# Patient Record
Sex: Female | Born: 1995
Health system: Southern US, Community
[De-identification: ages and names within clinical notes are randomized; demographics above are authoritative.]

## PROBLEM LIST (undated history)

## (undated) ENCOUNTER — Inpatient Hospital Stay (HOSPITAL_COMMUNITY): Payer: Self-pay

## (undated) DIAGNOSIS — A549 Gonococcal infection, unspecified: Secondary | ICD-10-CM

## (undated) DIAGNOSIS — D649 Anemia, unspecified: Secondary | ICD-10-CM

## (undated) HISTORY — DX: Anemia, unspecified: D64.9

## (undated) HISTORY — PX: NO PAST SURGERIES: SHX2092

---

## 2014-07-29 ENCOUNTER — Encounter (HOSPITAL_COMMUNITY): Payer: Self-pay | Admitting: Emergency Medicine

## 2014-07-29 ENCOUNTER — Emergency Department (HOSPITAL_COMMUNITY)
Admission: EM | Admit: 2014-07-29 | Discharge: 2014-07-29 | Disposition: A | Discharge status: Home / Self Care | Payer: Self-pay | Attending: Emergency Medicine | Admitting: Emergency Medicine

## 2014-07-29 DIAGNOSIS — Z3202 Encounter for pregnancy test, result negative: Secondary | ICD-10-CM | POA: Insufficient documentation

## 2014-07-29 DIAGNOSIS — N898 Other specified noninflammatory disorders of vagina: Secondary | ICD-10-CM | POA: Insufficient documentation

## 2014-07-29 DIAGNOSIS — A5901 Trichomonal vulvovaginitis: Secondary | ICD-10-CM | POA: Insufficient documentation

## 2014-07-29 DIAGNOSIS — Z79899 Other long term (current) drug therapy: Secondary | ICD-10-CM | POA: Insufficient documentation

## 2014-07-29 LAB — URINALYSIS, ROUTINE W REFLEX MICROSCOPIC
BILIRUBIN URINE: NEGATIVE
Glucose, UA: NEGATIVE mg/dL
Hgb urine dipstick: NEGATIVE
KETONES UR: NEGATIVE mg/dL
Nitrite: NEGATIVE
PH: 7 (ref 5.0–8.0)
PROTEIN: NEGATIVE mg/dL
Specific Gravity, Urine: 1.021 (ref 1.005–1.030)
Urobilinogen, UA: 1 mg/dL (ref 0.0–1.0)

## 2014-07-29 LAB — URINE MICROSCOPIC-ADD ON

## 2014-07-29 LAB — WET PREP, GENITAL
Clue Cells Wet Prep HPF POC: NONE SEEN
Yeast Wet Prep HPF POC: NONE SEEN

## 2014-07-29 LAB — POC URINE PREG, ED: Preg Test, Ur: NEGATIVE

## 2014-07-29 MED ORDER — METRONIDAZOLE 500 MG PO TABS
2000.0000 mg | ORAL_TABLET | Freq: Once | ORAL | Status: AC
  Administered 2014-07-29: 2000 mg via ORAL
  Filled 2014-07-29: qty 4

## 2014-07-29 NOTE — Discharge Instructions (Signed)
1. Medications:  usual home medications 2. Treatment: rest, drink plenty of fluids, no sexual intercourse for 7 days after treatment 3. Follow Up: Please followup with your primary doctor and the [facility] clinic for discussion of your diagnoses and further evaluation after today's visit; if you do not have a primary care doctor use the resource guide provided to find one;      Trichomoniasis Trichomoniasis is an infection caused by an organism called Trichomonas. The infection can affect both women and men. In women, the outer female genitalia and the vagina are affected. In men, the penis is mainly affected, but the prostate and other reproductive organs can also be involved. Trichomoniasis is a sexually transmitted infection (STI) and is most often passed to another person through sexual contact.  RISK FACTORS  Having unprotected sexual intercourse.  Having sexual intercourse with an infected partner. SIGNS AND SYMPTOMS  Symptoms of trichomoniasis in women include:  Abnormal gray-green frothy vaginal discharge.  Itching and irritation of the vagina.  Itching and irritation of the area outside the vagina. Symptoms of trichomoniasis in men include:   Penile discharge with or without pain.  Pain during urination. This results from inflammation of the urethra. DIAGNOSIS  Trichomoniasis may be found during a Pap test or physical exam. Your health care provider may use one of the following methods to help diagnose this infection:  Examining vaginal discharge under a microscope. For men, urethral discharge would be examined.  Testing the pH of the vagina with a test tape.  Using a vaginal swab test that checks for the Trichomonas organism. A test is available that provides results within a few minutes.  Doing a culture test for the organism. This is not usually needed. TREATMENT   You may be given medicine to fight the infection. Women should inform their health care provider if they  could be or are pregnant. Some medicines used to treat the infection should not be taken during pregnancy.  Your health care provider may recommend over-the-counter medicines or creams to decrease itching or irritation.  Your sexual partner will need to be treated if infected. HOME CARE INSTRUCTIONS   Take medicines only as directed by your health care provider.  Take over-the-counter medicine for itching or irritation as directed by your health care provider.  Do not have sexual intercourse while you have the infection.  Women should not douche or wear tampons while they have the infection.  Discuss your infection with your partner. Your partner may have gotten the infection from you, or you may have gotten it from your partner.  Have your sex partner get examined and treated if necessary.  Practice safe, informed, and protected sex.  See your health care provider for other STI testing. SEEK MEDICAL CARE IF:   You still have symptoms after you finish your medicine.  You develop abdominal pain.  You have pain when you urinate.  You have bleeding after sexual intercourse.  You develop a rash.  Your medicine makes you sick or makes you throw up (vomit). MAKE SURE YOU:  Understand these instructions.  Will watch your condition.  Will get help right away if you are not doing well or get worse. Document Released: 05/05/2001 Document Revised: 03/26/2014 Document Reviewed: 08/21/2013 [facility]: [facility] Patient Information 2015 [place], [place]. This information is not intended to replace advice given to you by your health care provider. Make sure you discuss any questions you have with your health care provider.  Vaginitis Vaginitis is an inflammation of  the vagina. It can happen when the normal bacteria and yeast in the vagina grow too much. There are different types. Treatment will depend on the type you have. HOME CARE  Take all medicines as told by your doctor.  Keep your vagina  area clean and dry. Avoid soap. Rinse the area with water.  Avoid washing and cleaning out the vagina (douching).  Do not use tampons or have sex (intercourse) until your treatment is done.  Wipe from front to back after going to the restroom.  Wear cotton underwear.  Avoid wearing underwear while you sleep until your vaginitis is gone.  Avoid tight pants. Avoid underwear or nylons without a cotton panel.  Take off wet clothing (such as a bathing suit) as soon as you can.  Use mild, unscented products. Avoid fabric softeners and scented:  Feminine sprays.  Laundry detergents.  Tampons.  Soaps or bubble baths.  Practice safe sex and use condoms. GET HELP RIGHT AWAY IF:   You have belly (abdominal) pain.  You have a fever or lasting symptoms for more than 2-3 days.  You have a fever and your symptoms suddenly get worse. MAKE SURE YOU:   Understand these instructions.  Will watch this condition.  Will get help right away if you are not doing well or get worse. Document Released: 02/05/2009 Document Revised: 08/03/2012 Document Reviewed: 04/21/2012 [facility] Patient Information 2015 [place], [place]. This information is not intended to replace advice given to you by your health care provider. Make sure you discuss any questions you have with your health care provider.

## 2014-07-29 NOTE — ED Provider Notes (Signed)
History/physical exam/procedure(s) were performed by non-physician practitioner and as supervising physician I was immediately available for consultation/collaboration. I have reviewed all notes and am in agreement with care and plan.   Hilario Quarry, MD 07/29/14 [phone]

## 2014-07-29 NOTE — ED Provider Notes (Signed)
CSN: [id number]     Arrival date & time 07/29/14  1418 History   First MD Initiated Contact with Patient 07/29/14 1530     Chief Complaint  Patient presents with  . Vaginal Discharge     (Consider location/radiation/quality/duration/timing/severity/associated sxs/prior Treatment) The history is provided by the patient and medical records. No language interpreter was used.    Diana Maxwell is a 18 y.o. female  with no medical Hx presents to the Emergency Department complaining of gradual, persistent, progressively worsening vaginal discharge increase with foul odor onset 1.5 months ago.  Pt reports a darkening of her urine, but denies urinary frequency, urgency or dysuria.  Pt reports she is not currently sexually active and last partner was in June.  Pt active with female partners in the past and no hx of STD, though she admits to genital warts. NO treatments pta.  Pt denies fever, chills, headache, neck pain, chest pain, abd pain, N/V/D, weakness, dizziness, syncope.  LMP: Jul 12, 2014.     History reviewed. No pertinent past medical history. History reviewed. No pertinent past surgical history. History reviewed. No pertinent family history. History  Substance Use Topics  . Smoking status: Never Smoker   . Smokeless tobacco: Not on file  . Alcohol Use: No   OB History   Grav Para Term Preterm Abortions TAB SAB Ect Mult Living                 Review of Systems  Constitutional: Negative for fever, diaphoresis, appetite change, fatigue and unexpected weight change.  HENT: Negative for mouth sores.   Eyes: Negative for visual disturbance.  Respiratory: Negative for cough, chest tightness, shortness of breath and wheezing.   Cardiovascular: Negative for chest pain.  Gastrointestinal: Negative for nausea, vomiting, abdominal pain, diarrhea and constipation.  Endocrine: Negative for polydipsia, polyphagia and polyuria.  Genitourinary: Positive for vaginal discharge. Negative for dysuria,  urgency, frequency and hematuria.  Musculoskeletal: Negative for back pain and neck stiffness.  Skin: Negative for rash.  Allergic/Immunologic: Negative for immunocompromised state.  Neurological: Negative for syncope, light-headedness and headaches.  Hematological: Does not bruise/bleed easily.  Psychiatric/Behavioral: Negative for sleep disturbance. The patient is not nervous/anxious.       Allergies  Review of patient's allergies indicates no known allergies.  Home Medications   Prior to Admission medications   Medication Sig Start Date End Date Taking? Authorizing Provider  Biotin 5 MG TABS Take 5 mg by mouth daily.   Yes Historical Provider, MD  Multiple Vitamin (MULTIVITAMIN WITH MINERALS) TABS tablet Take 1 tablet by mouth daily.   Yes Historical Provider, MD   BP 113/64  Pulse 62  Temp(Src) 98.4 F (36.9 C) (Oral)  Resp 16  Wt 165 lb (74.844 kg)  SpO2 100%  LMP 07/12/2014 Physical Exam  Nursing note and vitals reviewed. Constitutional: She is oriented to person, place, and time. She appears well-developed and well-nourished. No distress.  HENT:  Head: Normocephalic and atraumatic.  Eyes: Conjunctivae are normal.  Neck: Normal range of motion.  Cardiovascular: Normal rate, regular rhythm, normal heart sounds and intact distal pulses.   No murmur heard. Pulmonary/Chest: Effort normal and breath sounds normal. No respiratory distress. She has no wheezes.  Abdominal: Soft. Bowel sounds are normal. She exhibits no distension. There is no tenderness. There is no rebound and no guarding. Hernia confirmed negative in the right inguinal area and confirmed negative in the left inguinal area.  Genitourinary: Uterus normal. No labial fusion. There is  no rash, tenderness or lesion on the right labia. There is no rash, tenderness or lesion on the left labia. Uterus is not deviated, not enlarged, not fixed and not tender. Cervix exhibits no motion tenderness, no discharge and no  friability. Right adnexum displays no mass, no tenderness and no fullness. Left adnexum displays no mass, no tenderness and no fullness. There is erythema around the vagina. No tenderness or bleeding around the vagina. No foreign body around the vagina. No signs of injury around the vagina. Vaginal discharge ( Small, white, thin, malodorous) found.  No adnexal or cervical motion tenderness Erythema of the vaginal walls and cervix without friability  Musculoskeletal: Normal range of motion. She exhibits no edema.  Lymphadenopathy:       Right: No inguinal adenopathy present.       Left: No inguinal adenopathy present.  Neurological: She is alert and oriented to person, place, and time. She exhibits normal muscle tone. Coordination normal.  Skin: Skin is warm and dry. She is not diaphoretic. No erythema.  Psychiatric: She has a normal mood and affect.    ED Course  Procedures (including critical care time) Labs Review Labs Reviewed  WET PREP, GENITAL - Abnormal; Notable for the following:    Trich, Wet Prep FEW (*)    WBC, Wet Prep HPF POC FEW (*)    All other components within normal limits  URINALYSIS, ROUTINE W REFLEX MICROSCOPIC - Abnormal; Notable for the following:    Leukocytes, UA SMALL (*)    All other components within normal limits  GC/CHLAMYDIA PROBE AMP  URINE MICROSCOPIC-ADD ON  POC URINE PREG, ED    Imaging Review No results found.   EKG Interpretation None      MDM   Final diagnoses:  Trichomonal vaginitis    Diana Maxwell presents with 1-1/2 months of vaginal discharge. Patient denies new sexual partners or history of STD. Patient is using a scented body wash. Denies history of BV. On exam patient with evidence of vaginitis but no cervical motion tenderness, abdominal pain or cervical friability to suggest PID. UA and wet prep pending.   5:45 PM Wet prep with trichomonas.  Patient treated here in the emergency department. Gonorrhea and Chlamydia cultures  pending. UA without evidence of urinary tract infection. Patient without cervical motion tenderness to suggest PID and pregnancy test negative. Patient is to followup with her OB/GYN within one week for further evaluation. She's to return here to the emergency department for fevers, abdominal pain or intractable vomiting here  I have personally reviewed patient's vitals, nursing note and any pertinent labs or imaging.  I performed an undressed physical exam.    At this time, it has been determined that no acute conditions requiring further emergency intervention. The patient/guardian have been advised of the diagnosis and plan. I reviewed all labs and imaging including any potential incidental findings.   Vital signs are stable at discharge.   BP 113/64  Pulse 62  Temp(Src) 98.4 F (36.9 C) (Oral)  Resp 16  Wt 165 lb (74.844 kg)  SpO2 100%  LMP 07/12/2014        Dierdre Forth, PA-C 07/29/14 1745

## 2014-07-29 NOTE — ED Notes (Signed)
Pt states that she has been having yellow, foul smelling vaginal discharge x 1 month.  When asked if something happened to require her to come to the ER today, pt states "I thought I should go get it checked out".  Denies pain.  Denies unprotected sex.  Denies sex at all.

## 2014-07-29 NOTE — ED Notes (Signed)
Initial Contact - pt A+Ox4, reports vaginal discharge, foul smelling xweeks.  Pt denies dysuria or other complaints.  Skin PWD.  Speaking full/clear sentences, rr even/un-lab.  Changed to hospital gown.  NAD.

## 2014-07-31 LAB — GC/CHLAMYDIA PROBE AMP
CT PROBE, AMP APTIMA: NEGATIVE
GC PROBE AMP APTIMA: NEGATIVE

## 2014-09-01 ENCOUNTER — Emergency Department (HOSPITAL_COMMUNITY)
Admission: EM | Admit: 2014-09-01 | Discharge: 2014-09-01 | Disposition: A | Discharge status: Home / Self Care | Payer: Self-pay | Attending: Emergency Medicine | Admitting: Emergency Medicine

## 2014-09-01 ENCOUNTER — Encounter (HOSPITAL_COMMUNITY): Payer: Self-pay | Admitting: Emergency Medicine

## 2014-09-01 DIAGNOSIS — Z3202 Encounter for pregnancy test, result negative: Secondary | ICD-10-CM | POA: Insufficient documentation

## 2014-09-01 DIAGNOSIS — A599 Trichomoniasis, unspecified: Secondary | ICD-10-CM

## 2014-09-01 DIAGNOSIS — A5909 Other urogenital trichomoniasis: Secondary | ICD-10-CM | POA: Insufficient documentation

## 2014-09-01 LAB — POC URINE PREG, ED: Preg Test, Ur: NEGATIVE

## 2014-09-01 LAB — URINALYSIS, ROUTINE W REFLEX MICROSCOPIC
Bilirubin Urine: NEGATIVE
GLUCOSE, UA: NEGATIVE mg/dL
Hgb urine dipstick: NEGATIVE
Ketones, ur: NEGATIVE mg/dL
Nitrite: NEGATIVE
PH: 7.5 (ref 5.0–8.0)
Protein, ur: NEGATIVE mg/dL
Specific Gravity, Urine: 1.019 (ref 1.005–1.030)
Urobilinogen, UA: 1 mg/dL (ref 0.0–1.0)

## 2014-09-01 LAB — WET PREP, GENITAL
Clue Cells Wet Prep HPF POC: NONE SEEN
Yeast Wet Prep HPF POC: NONE SEEN

## 2014-09-01 LAB — HIV ANTIBODY (ROUTINE TESTING W REFLEX): HIV 1&2 Ab, 4th Generation: NONREACTIVE

## 2014-09-01 LAB — URINE MICROSCOPIC-ADD ON

## 2014-09-01 LAB — RPR

## 2014-09-01 MED ORDER — CEFTRIAXONE SODIUM 250 MG IJ SOLR
250.0000 mg | Freq: Once | INTRAMUSCULAR | Status: AC
  Administered 2014-09-01: 250 mg via INTRAMUSCULAR
  Filled 2014-09-01: qty 250

## 2014-09-01 MED ORDER — AZITHROMYCIN 250 MG PO TABS
1000.0000 mg | ORAL_TABLET | Freq: Once | ORAL | Status: AC
  Administered 2014-09-01: 1000 mg via ORAL
  Filled 2014-09-01: qty 4

## 2014-09-01 MED ORDER — STERILE WATER FOR INJECTION IJ SOLN
INTRAMUSCULAR | Status: AC
  Administered 2014-09-01: 1.5 mL
  Filled 2014-09-01: qty 10

## 2014-09-01 MED ORDER — METRONIDAZOLE 500 MG PO TABS
2000.0000 mg | ORAL_TABLET | Freq: Once | ORAL | Status: AC
  Administered 2014-09-01: 2000 mg via ORAL
  Filled 2014-09-01: qty 4

## 2014-09-01 NOTE — ED Notes (Signed)
Pt c/o vaginal discharge since 9/6.  Sts odorous, frothy, cream-colored discharge and yesterday noticed a brown tinging.  [facility]  Sts symptoms subsided for "a few days," but have returned.

## 2014-09-01 NOTE — Discharge Instructions (Signed)
Sexually Transmitted Disease We have obtained an HIV (AIDS) test and also a test for syphilis. Those tests are not back today and he will be called if they are abnormal. [facility] days to get a gynecologist, and to schedule the next available appointment. Use a condom each time that you have sex A sexually transmitted disease (STD) is a disease or infection that may be passed (transmitted) from person to person, usually during sexual activity. This may happen by way of saliva, semen, blood, vaginal mucus, or urine. Common STDs include:   Gonorrhea.   Chlamydia.   Syphilis.   HIV and AIDS.   Genital herpes.   Hepatitis B and C.   Trichomonas.   Human papillomavirus (HPV).   Pubic lice.   Scabies.  Mites.  Bacterial vaginosis. WHAT ARE CAUSES OF STDs? An STD may be caused by bacteria, a virus, or parasites. STDs are often transmitted during sexual activity if one person is infected. However, they may also be transmitted through nonsexual means. STDs may be transmitted after:   Sexual intercourse with an infected person.   Sharing sex toys with an infected person.   Sharing needles with an infected person or using unclean piercing or tattoo needles.  Having intimate contact with the genitals, mouth, or rectal areas of an infected person.   Exposure to infected fluids during birth. WHAT ARE THE SIGNS AND SYMPTOMS OF STDs? Different STDs have different symptoms. Some people may not have any symptoms. If symptoms are present, they may include:   Painful or bloody urination.   Pain in the pelvis, abdomen, vagina, anus, throat, or eyes.   A skin rash, itching, or irritation.  Growths, ulcerations, blisters, or sores in the genital and anal areas.  Abnormal vaginal discharge with or without bad odor.   Penile discharge in men.   Fever.   Pain or bleeding during sexual intercourse.   Swollen glands in the groin area.    Yellow skin and eyes (jaundice). This is seen with hepatitis.   Swollen testicles.  Infertility.  Sores and blisters in the mouth. HOW ARE STDs DIAGNOSED? To make a diagnosis, your health care provider may:   Take a medical history.   Perform a physical exam.   Take a sample of any discharge to examine.  Swab the throat, cervix, opening to the penis, rectum, or vagina for testing.  Test a sample of your first morning urine.   Perform blood tests.   Perform a Pap test, if this applies.   Perform a colposcopy.   Perform a laparoscopy.  HOW ARE STDs TREATED? Treatment depends on the STD. Some STDs may be treated but not cured.   Chlamydia, gonorrhea, trichomonas, and syphilis can be cured with antibiotic medicine.   Genital herpes, hepatitis, and HIV can be treated, but not cured, with prescribed medicines. The medicines lessen symptoms.   Genital warts from HPV can be treated with medicine or by freezing, burning (electrocautery), or surgery. Warts may come back.   HPV cannot be cured with medicine or surgery. However, abnormal areas may be removed from the cervix, vagina, or vulva.   If your diagnosis is confirmed, your recent sexual partners need treatment. This is true even if they are symptom-free or have a negative culture or evaluation. They should not have sex until their health care providers say it is okay. HOW CAN I REDUCE MY RISK OF GETTING AN STD? Take these steps to reduce your risk  of getting an STD:  Use latex condoms, dental dams, and water-soluble lubricants during sexual activity. Do not use petroleum jelly or oils.  Avoid having multiple sex partners.  Do not have sex with someone who has other sex partners.  Do not have sex with anyone you do not know or who is at high risk for an STD.  Avoid risky sex practices that can break your skin.  Do not have sex if you have open sores on your mouth or skin.  Avoid drinking too much  alcohol or taking illegal drugs. Alcohol and drugs can affect your judgment and put you in a vulnerable position.  Avoid engaging in oral and anal sex acts.  Get vaccinated for HPV and hepatitis. If you have not received these vaccines in the past, talk to your health care provider about whether one or both might be right for you.   If you are at risk of being infected with HIV, it is recommended that you take a prescription medicine daily to prevent HIV infection. This is called pre-exposure prophylaxis (PrEP). You are considered at risk if:  You are a man who has sex with other men (MSM).  You are a heterosexual man or woman and are sexually active with more than one partner.  You take drugs by injection.  You are sexually active with a partner who has HIV.  Talk with your health care provider about whether you are at high risk of being infected with HIV. If you choose to begin PrEP, you should first be tested for HIV. You should then be tested every 3 months for as long as you are taking PrEP.  WHAT SHOULD I DO IF I THINK I HAVE AN STD?  See your health care provider.   Tell your sexual partner(s). They should be tested and treated for any STDs.  Do not have sex until your health care provider says it is okay. WHEN SHOULD I GET IMMEDIATE MEDICAL CARE? Contact your health care provider right away if:   You have severe abdominal pain.  You are a man and notice swelling or pain in your testicles.  You are a woman and notice swelling or pain in your vagina. Document Released: 01/30/2003 Document Revised: 11/14/2013 Document Reviewed: 05/30/2013 [facility] Patient Information 2015 [place]. This information is not intended to replace advice given to you by your health care provider. Make sure you discuss any questions you have with your health care provider.  Trichomoniasis Trichomoniasis is an infection caused by an organism called Trichomonas. The infection can affect  both women and men. In women, the outer female genitalia and the vagina are affected. In men, the penis is mainly affected, but the prostate and other reproductive organs can also be involved. Trichomoniasis is a sexually transmitted infection (STI) and is most often passed to another person through sexual contact.  RISK FACTORS  Having unprotected sexual intercourse.  Having sexual intercourse with an infected partner. SIGNS AND SYMPTOMS  Symptoms of trichomoniasis in women include:  Abnormal gray-green frothy vaginal discharge.  Itching and irritation of the vagina.  Itching and irritation of the area outside the vagina. Symptoms of trichomoniasis in men include:   Penile discharge with or without pain.  Pain during urination. This results from inflammation of the urethra. DIAGNOSIS  Trichomoniasis may be found during a Pap test or physical exam. Your health care provider may use one of the following methods to help diagnose this infection:  Examining vaginal discharge under  a microscope. For men, urethral discharge would be examined.  Testing the pH of the vagina with a test tape.  Using a vaginal swab test that checks for the Trichomonas organism. A test is available that provides results within a few minutes.  Doing a culture test for the organism. This is not usually needed. TREATMENT   You may be given medicine to fight the infection. Women should inform their health care provider if they could be or are pregnant. Some medicines used to treat the infection should not be taken during pregnancy.  Your health care provider may recommend over-the-counter medicines or creams to decrease itching or irritation.  Your sexual partner will need to be treated if infected. HOME CARE INSTRUCTIONS   Take medicines only as directed by your health care provider.  Take over-the-counter medicine for itching or irritation as directed by your health care provider.  Do not have sexual  intercourse while you have the infection.  Women should not douche or wear tampons while they have the infection.  Discuss your infection with your partner. Your partner may have gotten the infection from you, or you may have gotten it from your partner.  Have your sex partner get examined and treated if necessary.  Practice safe, informed, and protected sex.  See your health care provider for other STI testing. SEEK MEDICAL CARE IF:   You still have symptoms after you finish your medicine.  You develop abdominal pain.  You have pain when you urinate.  You have bleeding after sexual intercourse.  You develop a rash.  Your medicine makes you sick or makes you throw up (vomit). MAKE SURE YOU:  Understand these instructions.  Will watch your condition.  Will get help right away if you are not doing well or get worse. Document Released: 05/05/2001 Document Revised: 03/26/2014 Document Reviewed: 08/21/2013 [facility] Patient Information 2015 [place], [place]. This information is not intended to replace advice given to you by your health care provider. Make sure you discuss any questions you have with your health care provider.

## 2014-09-01 NOTE — ED Provider Notes (Signed)
CSN: [id number]     Arrival date & time 09/01/14  1633 History   First MD Initiated Contact with Patient 09/01/14 1642     Chief Complaint  Patient presents with  . Vaginal Discharge     (Consider location/radiation/quality/duration/timing/severity/associated sxs/prior Treatment) Patient is a 18 y.o. female presenting with vaginal discharge.  Vaginal Discharge  Complains of vaginal discharge times one month. Denies pain anywhere. No fever. No urinary symptoms. Patient treated for trichomoniasis 07/29/2014, when she was treated for same complaint..no fever , no dyspareunmia, no nausea or vomiting no other associated symptoms History reviewed. No pertinent past medical history. History reviewed. No pertinent past surgical history. History reviewed. No pertinent family history. History  Substance Use Topics  . Smoking status: Never Smoker   . Smokeless tobacco: Not on file  . Alcohol Use: No   OB History   Grav Para Term Preterm Abortions TAB SAB Ect Mult Living                 Review of Systems  Constitutional: Negative.   HENT: Negative.   Respiratory: Negative.   Cardiovascular: Negative.   Gastrointestinal: Negative.   Genitourinary: Positive for vaginal discharge.  Musculoskeletal: Negative.   Skin: Negative.   Allergic/Immunologic: Negative.   Psychiatric/Behavioral: Negative.   All other systems reviewed and are negative.     Allergies  Review of patient's allergies indicates no known allergies.  Home Medications   Prior to Admission medications   Medication Sig Start Date End Date Taking? Authorizing Provider  Biotin 5 MG TABS Take 5 mg by mouth daily.    Historical Provider, MD  Multiple Vitamin (MULTIVITAMIN WITH MINERALS) TABS tablet Take 1 tablet by mouth daily.    Historical Provider, MD   BP 132/74  Pulse 64  Temp(Src) 98.1 F (36.7 C) (Oral)  Resp 16  SpO2 100%  LMP 08/22/2014 Physical Exam  Nursing note and vitals reviewed. Constitutional:  She appears well-developed and well-nourished.  HENT:  Head: Normocephalic and atraumatic.  Eyes: Conjunctivae are normal. Pupils are equal, round, and reactive to light.  Neck: Neck supple. No tracheal deviation present. No thyromegaly present.  Cardiovascular: Normal rate and regular rhythm.   No murmur heard. Pulmonary/Chest: Effort normal and breath sounds normal.  Abdominal: Soft. Bowel sounds are normal. She exhibits no distension. There is no tenderness.  Genitourinary: Vaginal discharge found.  White discharge , no external lesion, os closed cerxix mily reddened , slight cervical nmotion tenderness. No adnexal masses or tenderness  Musculoskeletal: Normal range of motion. She exhibits no edema and no tenderness.  Neurological: She is alert. Coordination normal.  Skin: Skin is warm and dry. No rash noted.  Psychiatric: She has a normal mood and affect.    ED Course  Procedures (including critical care time) Labs Review Labs Reviewed  URINALYSIS, ROUTINE W REFLEX MICROSCOPIC  POC URINE PREG, ED    Imaging Review No results found.   EKG Interpretation None     Results for orders placed during the hospital encounter of 09/01/14  WET PREP, GENITAL      Result Value Ref Range   Yeast Wet Prep HPF POC NONE SEEN  NONE SEEN   Trich, Wet Prep FEW (*) NONE SEEN   Clue Cells Wet Prep HPF POC NONE SEEN  NONE SEEN   WBC, Wet Prep HPF POC FEW (*) NONE SEEN  URINALYSIS, ROUTINE W REFLEX MICROSCOPIC      Result Value Ref Range   Color, Urine YELLOW  YELLOW   APPearance CLOUDY (*) CLEAR   Specific Gravity, Urine 1.019  1.005 - 1.030   pH 7.5  5.0 - 8.0   Glucose, UA NEGATIVE  NEGATIVE mg/dL   Hgb urine dipstick NEGATIVE  NEGATIVE   Bilirubin Urine NEGATIVE  NEGATIVE   Ketones, ur NEGATIVE  NEGATIVE mg/dL   Protein, ur NEGATIVE  NEGATIVE mg/dL   Urobilinogen, UA 1.0  0.0 - 1.0 mg/dL   Nitrite NEGATIVE  NEGATIVE   Leukocytes, UA LARGE (*) NEGATIVE  URINE MICROSCOPIC-ADD ON       Result Value Ref Range   Squamous Epithelial / LPF MANY (*) RARE   WBC, UA 21-50  <3 WBC/hpf   RBC / HPF 7-10  <3 RBC/hpf   Bacteria, UA FEW (*) RARE   Urine-Other TRICHOMONAS PRESENT    POC URINE PREG, ED      Result Value Ref Range   Preg Test, Ur NEGATIVE  NEGATIVE   No results found.  MDM  We'll treat empirically for STDs with Rocephin, Zithromax. Also to treat for trichomoniasis with Flagyl.urine is contaminated . Pt denies urinary sx. No need for culture Referral to women's clinic. Safe sex encouraged Diagnosis #1 trichomoniasis #2 cervicitis Final diagnoses:  None        Doug SouSam Sharaya Boruff, MD 09/01/14 1821

## 2014-09-03 LAB — GC/CHLAMYDIA PROBE AMP
CT PROBE, AMP APTIMA: NEGATIVE
GC PROBE AMP APTIMA: NEGATIVE

## 2014-11-23 DIAGNOSIS — A549 Gonococcal infection, unspecified: Secondary | ICD-10-CM

## 2014-11-23 HISTORY — DX: Gonococcal infection, unspecified: A54.9

## 2015-11-19 ENCOUNTER — Encounter (HOSPITAL_COMMUNITY): Payer: Self-pay | Admitting: Emergency Medicine

## 2015-11-19 ENCOUNTER — Emergency Department (HOSPITAL_COMMUNITY)
Admission: EM | Admit: 2015-11-19 | Discharge: 2015-11-20 | Disposition: A | Discharge status: Home / Self Care | Payer: Self-pay | Attending: Emergency Medicine | Admitting: Emergency Medicine

## 2015-11-19 DIAGNOSIS — N12 Tubulo-interstitial nephritis, not specified as acute or chronic: Secondary | ICD-10-CM | POA: Insufficient documentation

## 2015-11-19 DIAGNOSIS — Z3202 Encounter for pregnancy test, result negative: Secondary | ICD-10-CM | POA: Insufficient documentation

## 2015-11-19 LAB — CBC
HCT: 37.9 % (ref 36.0–46.0)
Hemoglobin: 12.8 g/dL (ref 12.0–15.0)
MCH: 29.6 pg (ref 26.0–34.0)
MCHC: 33.8 g/dL (ref 30.0–36.0)
MCV: 87.5 fL (ref 78.0–100.0)
Platelets: 289 10*3/uL (ref 150–400)
RBC: 4.33 MIL/uL (ref 3.87–5.11)
RDW: 12.8 % (ref 11.5–15.5)
WBC: 5.1 10*3/uL (ref 4.0–10.5)

## 2015-11-19 LAB — COMPREHENSIVE METABOLIC PANEL
ALT: 16 U/L (ref 14–54)
AST: 21 U/L (ref 15–41)
Albumin: 4.4 g/dL (ref 3.5–5.0)
Alkaline Phosphatase: 90 U/L (ref 38–126)
Anion gap: 9 (ref 5–15)
BUN: 10 mg/dL (ref 6–20)
CHLORIDE: 103 mmol/L (ref 101–111)
CO2: 28 mmol/L (ref 22–32)
Calcium: 9.4 mg/dL (ref 8.9–10.3)
Creatinine, Ser: 0.93 mg/dL (ref 0.44–1.00)
Glucose, Bld: 108 mg/dL — ABNORMAL HIGH (ref 65–99)
Potassium: 4.6 mmol/L (ref 3.5–5.1)
Sodium: 140 mmol/L (ref 135–145)
Total Bilirubin: 0.5 mg/dL (ref 0.3–1.2)
Total Protein: 7.7 g/dL (ref 6.5–8.1)

## 2015-11-19 LAB — LIPASE, BLOOD: LIPASE: 32 U/L (ref 11–51)

## 2015-11-19 LAB — I-STAT BETA HCG BLOOD, ED (MC, WL, AP ONLY): I-stat hCG, quantitative: <5 m[IU]/mL (ref <5)

## 2015-11-19 NOTE — ED Notes (Addendum)
Pt reports RLQ pain for the past 2 days that is worse with movement. No bowel issues or dysuria. LMP a week ago

## 2015-11-20 ENCOUNTER — Encounter (HOSPITAL_COMMUNITY): Payer: Self-pay | Admitting: Emergency Medicine

## 2015-11-20 LAB — URINALYSIS, ROUTINE W REFLEX MICROSCOPIC
Bilirubin Urine: NEGATIVE
Glucose, UA: NEGATIVE mg/dL
Hgb urine dipstick: NEGATIVE
Ketones, ur: NEGATIVE mg/dL
NITRITE: NEGATIVE
PH: 7.5 (ref 5.0–8.0)
PROTEIN: NEGATIVE mg/dL
Specific Gravity, Urine: 1.016 (ref 1.005–1.030)

## 2015-11-20 LAB — URINE MICROSCOPIC-ADD ON

## 2015-11-20 MED ORDER — CEPHALEXIN 500 MG PO CAPS: 500.0000 mg | ORAL_CAPSULE | Freq: Three times a day (TID) | ORAL | Status: DC

## 2015-11-20 MED ORDER — ONDANSETRON 4 MG PO TBDP: 4.0000 mg | ORAL_TABLET | Freq: Three times a day (TID) | ORAL | Status: DC | PRN

## 2015-11-20 NOTE — ED Notes (Signed)
Requested patient to urinate. 

## 2015-11-20 NOTE — ED Provider Notes (Signed)
CSN: [id number]     Arrival date & time 11/19/15  1833 History  By signing my name below, I, Diana Maxwell, attest that this documentation has been prepared under the direction and in the presence of [name], MD. Electronically Signed: [name], ED Scribe. 11/20/2015. 12:59 AM.   Chief Complaint  Patient presents with  . Abdominal Pain   Patient is a 19 y.o. female presenting with abdominal pain. The history is provided by the patient. No language interpreter was used.  Abdominal Pain Pain location:  R flank Pain quality: aching   Pain radiates to:  Does not radiate Pain severity:  Moderate Onset quality:  Sudden Duration:  2 days Timing:  Constant Progression:  Worsening Chronicity:  New Context: not eating and not recent illness   Worsened by:  Movement Ineffective treatments:  None tried Associated symptoms: no chest pain, no chills, no constipation, no cough, no diarrhea, no dysuria, no fever, no hematemesis, no hematochezia, no hematuria, no nausea, no shortness of breath, no sore throat, no vaginal bleeding, no vaginal discharge and no vomiting   HPI Comments: Diana Maxwell is a 19 y.o. female who presents to the Emergency Department complaining of constant, non-radiating, aching RLQ abdominal pain onset 1 day ago. Pt reports worsening pain with making movement. She reports alleviation with sitting still. She currently denies pain. She denies worsening pain with eating, strenuous activity or injury that could have caused the pain. Pt denies trying anything for her symptoms and denies hx of similar symptoms. She denies fever, chills, cough, nausea, vomiting, hematemesis, diarrhea, constipation, vaginal bleeding, vaginal discharge, hematuria, frequency, or dysuria. LMP was one week ago. Pt is not currently sexually active and has no concerns for STDs at this time.   History reviewed. No pertinent past medical history. History reviewed. No pertinent past surgical  history. History reviewed. No pertinent family history. Social History  Substance Use Topics  . Smoking status: Never Smoker   . Smokeless tobacco: None  . Alcohol Use: No   OB History    No data available     Review of Systems  Constitutional: Negative for fever and chills.  HENT: Negative for sore throat.   Eyes: Negative for visual disturbance.  Respiratory: Negative for cough and shortness of breath.   Cardiovascular: Negative for chest pain.  Gastrointestinal: Positive for abdominal pain. Negative for nausea, vomiting, diarrhea, constipation, hematochezia and hematemesis.  Genitourinary: Positive for flank pain. Negative for dysuria, hematuria, vaginal bleeding, vaginal discharge and difficulty urinating.  Musculoskeletal: Negative for back pain and neck pain.  Skin: Negative for rash.  Neurological: Negative for syncope and headaches.   Allergies  Review of patient's allergies indicates no known allergies.  Home Medications   Prior to Admission medications   Medication Sig Start Date End Date Taking? Authorizing Provider  ibuprofen (ADVIL,MOTRIN) 200 MG tablet Take 400 mg by mouth every 6 (six) hours as needed for headache, mild pain or moderate pain.   Yes Historical Provider, MD  cephALEXin (KEFLEX) 500 MG capsule Take 1 capsule (500 mg total) by mouth 3 (three) times daily. 11/20/15   [name], MD  ondansetron (ZOFRAN ODT) 4 MG disintegrating tablet Take 1 tablet (4 mg total) by mouth every 8 (eight) hours as needed for nausea or vomiting. 11/20/15   [name], MD   BP 113/79 mmHg  Pulse 78  Temp(Src) 97.4 F (36.3 C) (Oral)  Resp 16  Ht 5' 11.5" (1.816 m)  Wt 165 lb (74.844 kg)  BMI 22.69 kg/m2  SpO2 95%  LMP 11/06/2015 (Exact Date) Physical Exam  Constitutional: She is oriented to person, place, and time. She appears well-developed and well-nourished. No distress.  HENT:  Head: Normocephalic and atraumatic.  Eyes: Conjunctivae and EOM are  normal.  Neck: Normal range of motion.  Cardiovascular: Normal rate, regular rhythm, normal heart sounds and intact distal pulses.  Exam reveals no gallop and no friction rub.   No murmur heard. Pulmonary/Chest: Effort normal and breath sounds normal. No respiratory distress. She has no wheezes. She has no rales.  Clear to auscultation bilaterally  Abdominal: Soft. She exhibits no distension. There is tenderness. There is no guarding, no CVA tenderness, no tenderness at McBurney's point and negative Murphy's sign.  Right lateral abdominal tenderness  Musculoskeletal: She exhibits no edema or tenderness.  Neurological: She is alert and oriented to person, place, and time.  Skin: Skin is warm and dry. No rash noted. She is not diaphoretic. No erythema.  Nursing note and vitals reviewed.   ED Course  Procedures (including critical care time) DIAGNOSTIC STUDIES: Oxygen Saturation is 95% on RA, adequate by my interpretation.    COORDINATION OF CARE: 12:51 AM-Discussed treatment plan which includes labs with pt at bedside and pt agreed to plan.    Labs Review Labs Reviewed  COMPREHENSIVE METABOLIC PANEL - Abnormal; Notable for the following:    Glucose, Bld 108 (*)    All other components within normal limits  URINALYSIS, ROUTINE W REFLEX MICROSCOPIC (NOT AT [facility]) - Abnormal; Notable for the following:    APPearance CLOUDY (*)    Leukocytes, UA SMALL (*)    All other components within normal limits  URINE MICROSCOPIC-ADD ON - Abnormal; Notable for the following:    Squamous Epithelial / LPF TOO NUMEROUS TO COUNT (*)    Bacteria, UA MANY (*)    All other components within normal limits  LIPASE, BLOOD  CBC  I-STAT BETA HCG BLOOD, ED ([facility], [facility], [facility] ONLY)    Imaging Review No results found. I have personally reviewed and evaluated these images and lab results as part of my medical decision-making.   EKG Interpretation None      MDM   Final diagnoses:  Pyelonephritis    19 year old female in no severe medical history presents with concern of right sided abdominal pain. Differential diagnosis includes cholecystitis, hepatitis, pancreatic toes, nephrolithiasis, pyelonephritis. Patient has no nausea vomiting, no fevers, negative Murphy's sign, negative pain at McBurney's, and have low suspicion for acute cholecystitis or appendicitis. She denies any pelvic symptoms and location of pain is not consistent with PID, ovarian torsion, or TOA. Pregnancy test is negative.  Urinalysis does appear contaminated, however given symptoms and tenderness, we will treat for polynephritis with 2 weeks of Keflex. Pt also may have muscolokeletal etiology pain and recommended ibuprofen, ice and heat, however will give Keflex for possibility of pyelonephritis given location of pain and possible findings on urinalysis. Patient discharged in stable condition with understanding of reasons to return.   I personally performed the services described in this documentation, which was scribed in my presence. The recorded information has been reviewed and is accurate.   Alvira MondayErin Ronn Smolinsky, MD 11/20/15 1911

## 2015-11-20 NOTE — Discharge Instructions (Signed)

## 2016-04-30 ENCOUNTER — Emergency Department (HOSPITAL_COMMUNITY)
Admission: EM | Admit: 2016-04-30 | Discharge: 2016-04-30 | Disposition: A | Discharge status: Home / Self Care | Payer: Self-pay | Attending: Emergency Medicine | Admitting: Emergency Medicine

## 2016-04-30 ENCOUNTER — Encounter (HOSPITAL_COMMUNITY): Payer: Self-pay | Admitting: Emergency Medicine

## 2016-04-30 DIAGNOSIS — B373 Candidiasis of vulva and vagina: Secondary | ICD-10-CM | POA: Insufficient documentation

## 2016-04-30 DIAGNOSIS — Z79899 Other long term (current) drug therapy: Secondary | ICD-10-CM | POA: Insufficient documentation

## 2016-04-30 DIAGNOSIS — B379 Candidiasis, unspecified: Secondary | ICD-10-CM

## 2016-04-30 LAB — PREGNANCY, URINE: Preg Test, Ur: NEGATIVE

## 2016-04-30 LAB — URINALYSIS, ROUTINE W REFLEX MICROSCOPIC
Bilirubin Urine: NEGATIVE
GLUCOSE, UA: NEGATIVE mg/dL
Hgb urine dipstick: NEGATIVE
KETONES UR: NEGATIVE mg/dL
Nitrite: NEGATIVE
PH: 7.5 (ref 5.0–8.0)
Protein, ur: NEGATIVE mg/dL
SPECIFIC GRAVITY, URINE: 1.014 (ref 1.005–1.030)

## 2016-04-30 LAB — URINE MICROSCOPIC-ADD ON

## 2016-04-30 LAB — WET PREP, GENITAL
Sperm: NONE SEEN
Trich, Wet Prep: NONE SEEN

## 2016-04-30 MED ORDER — AZITHROMYCIN 1 G PO PACK
1.0000 g | PACK | Freq: Once | ORAL | Status: AC
  Administered 2016-04-30: 1 g via ORAL
  Filled 2016-04-30: qty 1

## 2016-04-30 MED ORDER — FLUCONAZOLE 150 MG PO TABS: 150.0000 mg | ORAL_TABLET | Freq: Every day | ORAL | Status: AC

## 2016-04-30 MED ORDER — CEFTRIAXONE SODIUM 250 MG IJ SOLR
250.0000 mg | Freq: Once | INTRAMUSCULAR | Status: AC
  Administered 2016-04-30: 250 mg via INTRAMUSCULAR
  Filled 2016-04-30: qty 250

## 2016-04-30 NOTE — ED Provider Notes (Signed)
CSN: [id number]     Arrival date & time 04/30/16  1818 History   First MD Initiated Contact with Patient 04/30/16 1938     Chief Complaint  Patient presents with  . Vaginal Discharge     (Consider location/radiation/quality/duration/timing/severity/associated sxs/prior Treatment) HPI   Patient is a 20 year old female with no significant past medical history presents the ED with vaginal discharge since last night. Patient states she noticed a white discharge without odor. Patient states she's had yeast infections in the past and this is similar. She has not taken anything for this. Patient endorses mild itching. She denies dysuria, hematuria, abdominal pain, fever, chills. Patient endorses a new sexual partner and she is not using protection. She states she is going to the health Department on Monday to have HIV and syphilis testing. She is declining that testing here.  History reviewed. No pertinent past medical history. History reviewed. No pertinent past surgical history. History reviewed. No pertinent family history. Social History  Substance Use Topics  . Smoking status: Never Smoker   . Smokeless tobacco: None  . Alcohol Use: No   OB History    No data available     Review of Systems  Constitutional: Negative for fever and chills.  Cardiovascular: Negative for chest pain.  Gastrointestinal: Negative for nausea, vomiting and abdominal pain.  Genitourinary: Positive for vaginal discharge. Negative for dysuria, hematuria and vaginal bleeding.      Allergies  Review of patient's allergies indicates no known allergies.  Home Medications   Prior to Admission medications   Medication Sig Start Date End Date Taking? Authorizing Provider  cephALEXin (KEFLEX) 500 MG capsule Take 1 capsule (500 mg total) by mouth 3 (three) times daily. Patient not taking: Reported on 04/30/2016 11/20/15   Alvira MondayErin Schlossman, MD  fluconazole (DIFLUCAN) 150 MG tablet Take 1 tablet (150 mg total) by  mouth daily. 04/30/16 05/07/16  Jerre SimonJessica L Aarish Rockers, PA  ibuprofen (ADVIL,MOTRIN) 200 MG tablet Take 400 mg by mouth every 6 (six) hours as needed for headache, mild pain or moderate pain.    Historical Provider, MD  ondansetron (ZOFRAN ODT) 4 MG disintegrating tablet Take 1 tablet (4 mg total) by mouth every 8 (eight) hours as needed for nausea or vomiting. Patient not taking: Reported on 04/30/2016 11/20/15   Alvira MondayErin Schlossman, MD   BP 119/74 mmHg  Pulse 61  Temp(Src) 98.8 F (37.1 C) (Oral)  Resp 13  Ht 5' 11.5" (1.816 m)  Wt 72.576 kg  BMI 22.01 kg/m2  SpO2 100% Physical Exam  Constitutional: She appears well-developed and well-nourished. No distress.  HENT:  Head: Normocephalic and atraumatic.  Eyes: Conjunctivae are normal.  Cardiovascular: Normal rate, regular rhythm and normal heart sounds.  Exam reveals no gallop and no friction rub.   No murmur heard. Pulses:      Dorsalis pedis pulses are 2+ on the right side, and 2+ on the left side.  Pulmonary/Chest: Effort normal.  Abdominal: Soft. Bowel sounds are normal. She exhibits no distension. There is no tenderness. There is no rebound and no guarding.  Genitourinary:  Exam performed by Jerre SimonJessica L Sonam Wandel,  exam chaperoned Date: 04/30/2016 Pelvic exam: normal external genitalia without evidence of trauma. VULVA: normal appearing vulva with no masses, tenderness or lesion. VAGINA: normal appearing vagina with normal color and discharge, no lesions. CERVIX: normal appearing cervix without lesions, cervical motion tenderness absent, cervical os closed with out purulent discharge; vaginal discharge - white, copious, creamy and curd-like, Wet prep and DNA probe  for chlamydia and GC obtained.   ADNEXA: normal adnexa in size, nontender and no masses UTERUS: uterus is normal size, shape, consistency and nontender.    Musculoskeletal: Normal range of motion. She exhibits no edema.  Neurological: She is alert. Coordination normal.  Skin: Skin is  warm and dry.  Psychiatric: She has a normal mood and affect. Her behavior is normal.  Nursing note and vitals reviewed.   ED Course  Procedures (including critical care time) Labs Review Labs Reviewed  WET PREP, GENITAL - Abnormal; Notable for the following:    Yeast Wet Prep HPF POC PRESENT (*)    Clue Cells Wet Prep HPF POC PRESENT (*)    WBC, Wet Prep HPF POC MANY (*)    All other components within normal limits  URINALYSIS, ROUTINE W REFLEX MICROSCOPIC (NOT AT [facility]) - Abnormal; Notable for the following:    Leukocytes, UA SMALL (*)    All other components within normal limits  URINE MICROSCOPIC-ADD ON - Abnormal; Notable for the following:    Squamous Epithelial / LPF 6-30 (*)    Bacteria, UA RARE (*)    All other components within normal limits  PREGNANCY, URINE  GC/CHLAMYDIA PROBE AMP ([facility] [facility]) NOT AT [facility]    Imaging Review No results found. I have personally reviewed and evaluated these images and lab results as part of my medical decision-making.   EKG Interpretation None      MDM   Final diagnoses:  Yeast infection   Patient with yeast infection and treated for gonorrhea and chlamydia in the ED. Patient discharged with instructions to follow up with OBGYN/health dept. Discussed importance of using protection when sexually active. Pt understands that they have GC/Chlamydia cultures pending and that they will need to inform all sexual partners if results return positive. Pt has been treated prophylacticly with azithromycin and rocephin due to pts history, pelvic exam, and wet prep with increased WBCs. Pt not concerning for PID because hemodynamically stable and no cervical motion tenderness on pelvic exam. Pt has also been treated with Diflucan for yeast infection. Pt declined RPR and HIV testing at this time. She states she is going to the health department on Monday with her sexual partner for STD testing. Discussed strict return precautions. She expressed  understanding to the discharge instructions.    Jerre Simon, PA 05/01/16 0981  Pricilla Loveless, MD 05/04/16 [phone]

## 2016-04-30 NOTE — ED Notes (Signed)
Pt reports white vaginal discharge, thinks she is getting a yeast infection

## 2016-04-30 NOTE — Discharge Instructions (Signed)
You have been treated in the ED for gonorrhea and chlamydia. You have received a prescription for Diflucan. Take 1 pill today and if your symptoms persist in 3 days take another pill. Follow-up with the health department on Monday to have a complete STD screen. Follow-up with the health Department also if your symptoms persist.  Return to the emergency department if you experience unusual vaginal discharge, pain with sex, abdominal pain, fever, chills.

## 2016-05-01 LAB — GC/CHLAMYDIA PROBE AMP (~~LOC~~) NOT AT ARMC
CHLAMYDIA, DNA PROBE: NEGATIVE
Neisseria Gonorrhea: NEGATIVE

## 2016-08-21 ENCOUNTER — Emergency Department (HOSPITAL_COMMUNITY)
Admission: EM | Admit: 2016-08-21 | Discharge: 2016-08-21 | Disposition: A | Discharge status: Home / Self Care | Payer: Self-pay | Attending: Emergency Medicine | Admitting: Emergency Medicine

## 2016-08-21 ENCOUNTER — Encounter (HOSPITAL_COMMUNITY): Payer: Self-pay

## 2016-08-21 DIAGNOSIS — Z87891 Personal history of nicotine dependence: Secondary | ICD-10-CM | POA: Insufficient documentation

## 2016-08-21 DIAGNOSIS — R109 Unspecified abdominal pain: Secondary | ICD-10-CM

## 2016-08-21 DIAGNOSIS — Z79899 Other long term (current) drug therapy: Secondary | ICD-10-CM | POA: Insufficient documentation

## 2016-08-21 DIAGNOSIS — N939 Abnormal uterine and vaginal bleeding, unspecified: Secondary | ICD-10-CM | POA: Insufficient documentation

## 2016-08-21 LAB — WET PREP, GENITAL
Clue Cells Wet Prep HPF POC: NONE SEEN
Sperm: NONE SEEN
Trich, Wet Prep: NONE SEEN
Yeast Wet Prep HPF POC: NONE SEEN

## 2016-08-21 LAB — URINALYSIS W MICROSCOPIC (NOT AT ARMC)
BILIRUBIN URINE: NEGATIVE
GLUCOSE, UA: NEGATIVE mg/dL
KETONES UR: NEGATIVE mg/dL
NITRITE: NEGATIVE
PH: 6.5 (ref 5.0–8.0)
Protein, ur: 30 mg/dL — AB
Specific Gravity, Urine: 1.008 (ref 1.005–1.030)

## 2016-08-21 LAB — POC URINE PREG, ED: PREG TEST UR: NEGATIVE

## 2016-08-21 NOTE — ED Triage Notes (Signed)
Pt c/o increasing vaginal bleeding, "slight" lower abdominal cramping, and "a little" low back pain x 1 day.  Denies pain.  LMP ended 9/22.  Denies urinary complaints.  Pt is not on birth control.

## 2016-08-21 NOTE — ED Provider Notes (Signed)
[facility]-EMERGENCY DEPT Provider Note   CSN: [id number] Arrival date & time: 08/21/16  0818     History   Chief Complaint Chief Complaint  Patient presents with  . Abdominal Cramping  . Vaginal Bleeding    HPI Diana Maxwell is a 20 y.o. female.  The history is provided by the patient.  Abdominal Cramping  This is a new (mild) problem. The current episode started yesterday. Associated symptoms include abdominal pain. Pertinent negatives include no chest pain, no headaches and no shortness of breath.  Vaginal Bleeding  Primary symptoms include pelvic pain, vaginal bleeding.  Primary symptoms include no discharge, no dysuria. There has been no fever. Chronicity: similar to menstrual cycle, but just completed her cycle last week. She has not missed her period. Her LMP was weeks ago. Associated symptoms include abdominal pain. Pertinent negatives include no vomiting. Associated medical issues include STD.    History reviewed. No pertinent past medical history.  There are no active problems to display for this patient.   History reviewed. No pertinent surgical history.  OB History    No data available       Home Medications    Prior to Admission medications   Medication Sig Start Date End Date Taking? Authorizing Provider  diphenhydrAMINE (BENADRYL) 12.5 MG/5ML liquid Take 25 mg by mouth 4 (four) times daily as needed for allergies.   Yes Historical Provider, MD  ibuprofen (ADVIL,MOTRIN) 200 MG tablet Take 400 mg by mouth every 6 (six) hours as needed for headache, mild pain or moderate pain.   Yes Historical Provider, MD    Family History History reviewed. No pertinent family history.  Social History Social History  Substance Use Topics  . Smoking status: Former Games developermoker  . Smokeless tobacco: Never Used  . Alcohol use Yes     Allergies   Pineapple   Review of Systems Review of Systems  Constitutional: Negative for chills and fever.  HENT: Negative for ear pain  and sore throat.   Eyes: Negative for pain and visual disturbance.  Respiratory: Negative for cough and shortness of breath.   Cardiovascular: Negative for chest pain and palpitations.  Gastrointestinal: Positive for abdominal pain. Negative for vomiting.  Genitourinary: Positive for pelvic pain and vaginal bleeding. Negative for dysuria, hematuria and vaginal discharge.  Musculoskeletal: Negative for arthralgias and back pain.  Skin: Negative for color change and rash.  Neurological: Negative for seizures, syncope and headaches.  All other systems reviewed and are negative.    Physical Exam Updated Vital Signs BP 133/84 (BP Location: Right Arm)   Pulse 61   Temp 98 F (36.7 C) (Oral)   Resp 16   LMP 08/14/2016   SpO2 100%   Physical Exam  Constitutional: She is oriented to person, place, and time. She appears well-developed and well-nourished. No distress.  HENT:  Head: Normocephalic and atraumatic.  Nose: Nose normal.  Eyes: Conjunctivae and EOM are normal. Pupils are equal, round, and reactive to light. Right eye exhibits no discharge. Left eye exhibits no discharge. No scleral icterus.  Neck: Normal range of motion. Neck supple.  Cardiovascular: Normal rate and regular rhythm.  Exam reveals no gallop and no friction rub.   No murmur heard. Pulmonary/Chest: Effort normal and breath sounds normal. No stridor. No respiratory distress. She has no rales.  Abdominal: Soft. She exhibits no distension. There is no tenderness.  Genitourinary: Pelvic exam was performed with patient supine. Uterus is not tender. Cervix exhibits no motion tenderness, no discharge and  no friability. Right adnexum displays no mass and no tenderness. Left adnexum displays no mass and no tenderness. There is bleeding in the vagina. No erythema or tenderness in the vagina. No foreign body in the vagina. No vaginal discharge found.  Genitourinary Comments: Chaperone present during pelvic exam.     Musculoskeletal: She exhibits no edema or tenderness.  Neurological: She is alert and oriented to person, place, and time.  Skin: Skin is warm and dry. No rash noted. She is not diaphoretic. No erythema.  Psychiatric: She has a normal mood and affect.  Vitals reviewed.    ED Treatments / Results  Labs (all labs ordered are listed, but only abnormal results are displayed) Labs Reviewed  WET PREP, GENITAL - Abnormal; Notable for the following:       Result Value   WBC, Wet Prep HPF POC FEW (*)    All other components within normal limits  URINALYSIS W MICROSCOPIC (NOT AT [facility]) - Abnormal; Notable for the following:    Color, Urine RED (*)    APPearance CLOUDY (*)    Hgb urine dipstick LARGE (*)    Protein, ur 30 (*)    Leukocytes, UA SMALL (*)    Bacteria, UA FEW (*)    Squamous Epithelial / LPF TOO NUMEROUS TO COUNT (*)    All other components within normal limits  POC URINE PREG, ED  GC/CHLAMYDIA PROBE AMP ([place]) NOT AT [facility]    EKG  EKG Interpretation None       Radiology No results found.  Procedures Procedures (including critical care time)  Medications Ordered in ED Medications - No data to display   Initial Impression / Assessment and Plan / ED Course  I have reviewed the triage vital signs and the nursing notes.  Pertinent labs & imaging results that were available during my care of the patient were reviewed by me and considered in my medical decision making (see chart for details).  Clinical Course    UPT negative. No evidence to suggest PID. Low suspicion for torsion or appendicitis. Exam not consistent with cervicitis. Wet prep negative for Trichomonas or bacterial vaginosis. Don't feel that empiric treatment for GC/chlamydia is warranted at this time. Next  Patient safe for discharge with strict return precautions. Patient to establish care and follow-up with OB/GYN.  Final Clinical Impressions(s) / ED Diagnoses   Final diagnoses:   Abdominal cramping  Abnormal vaginal bleeding   Disposition: Discharge  Condition: Good  I have discussed the results, Dx and Tx plan with the patient who expressed understanding and agree(s) with the plan. Discharge instructions discussed at great length. The patient was given strict return precautions who verbalized understanding of the instructions. No further questions at time of discharge.    Current Discharge Medication List      Follow Up: [facility] [address] [place] [address] [phone] Schedule an appointment as soon as possible for a visit  As needed      Nira Conn, MD 08/21/16 1126

## 2016-08-24 LAB — GC/CHLAMYDIA PROBE AMP (~~LOC~~) NOT AT ARMC
Chlamydia: NEGATIVE
Neisseria Gonorrhea: NEGATIVE

## 2016-10-18 ENCOUNTER — Encounter (HOSPITAL_COMMUNITY): Payer: Self-pay

## 2016-10-18 ENCOUNTER — Emergency Department (HOSPITAL_COMMUNITY): Payer: Medicaid Other

## 2016-10-18 ENCOUNTER — Emergency Department (HOSPITAL_COMMUNITY)
Admission: EM | Admit: 2016-10-18 | Discharge: 2016-10-18 | Disposition: A | Discharge status: Home / Self Care | Payer: Medicaid Other | Attending: Emergency Medicine | Admitting: Emergency Medicine

## 2016-10-18 DIAGNOSIS — O26891 Other specified pregnancy related conditions, first trimester: Secondary | ICD-10-CM | POA: Diagnosis not present

## 2016-10-18 DIAGNOSIS — Z3A09 9 weeks gestation of pregnancy: Secondary | ICD-10-CM | POA: Diagnosis not present

## 2016-10-18 DIAGNOSIS — Z87891 Personal history of nicotine dependence: Secondary | ICD-10-CM | POA: Diagnosis not present

## 2016-10-18 DIAGNOSIS — O0281 Inappropriate change in quantitative human chorionic gonadotropin (hCG) in early pregnancy: Secondary | ICD-10-CM | POA: Diagnosis not present

## 2016-10-18 DIAGNOSIS — R109 Unspecified abdominal pain: Secondary | ICD-10-CM | POA: Insufficient documentation

## 2016-10-18 LAB — CBC WITH DIFFERENTIAL/PLATELET
Basophils Absolute: 0 10*3/uL (ref 0.0–0.1)
Basophils Relative: 0 %
EOS ABS: 0.2 10*3/uL (ref 0.0–0.7)
EOS PCT: 4 %
HCT: 32.8 % — ABNORMAL LOW (ref 36.0–46.0)
Hemoglobin: 11.7 g/dL — ABNORMAL LOW (ref 12.0–15.0)
LYMPHS ABS: 1.6 10*3/uL (ref 0.7–4.0)
LYMPHS PCT: 30 %
MCH: 30.2 pg (ref 26.0–34.0)
MCHC: 35.7 g/dL (ref 30.0–36.0)
MCV: 84.8 fL (ref 78.0–100.0)
MONO ABS: 0.4 10*3/uL (ref 0.1–1.0)
MONOS PCT: 8 %
Neutro Abs: 3 10*3/uL (ref 1.7–7.7)
Neutrophils Relative %: 58 %
PLATELETS: 248 10*3/uL (ref 150–400)
RBC: 3.87 MIL/uL (ref 3.87–5.11)
RDW: 12.9 % (ref 11.5–15.5)
WBC: 5.2 10*3/uL (ref 4.0–10.5)

## 2016-10-18 LAB — URINALYSIS, ROUTINE W REFLEX MICROSCOPIC
Bilirubin Urine: NEGATIVE
GLUCOSE, UA: NEGATIVE mg/dL
HGB URINE DIPSTICK: NEGATIVE
KETONES UR: NEGATIVE mg/dL
Nitrite: NEGATIVE
PH: 8 (ref 5.0–8.0)
PROTEIN: NEGATIVE mg/dL
Specific Gravity, Urine: 1.019 (ref 1.005–1.030)

## 2016-10-18 LAB — URINE MICROSCOPIC-ADD ON: RBC / HPF: NONE SEEN RBC/hpf (ref 0–5)

## 2016-10-18 LAB — COMPREHENSIVE METABOLIC PANEL
ALBUMIN: 3.8 g/dL (ref 3.5–5.0)
ALT: 17 U/L (ref 14–54)
AST: 21 U/L (ref 15–41)
Alkaline Phosphatase: 63 U/L (ref 38–126)
Anion gap: 5 (ref 5–15)
BUN: 7 mg/dL (ref 6–20)
CHLORIDE: 104 mmol/L (ref 101–111)
CO2: 24 mmol/L (ref 22–32)
CREATININE: 0.73 mg/dL (ref 0.44–1.00)
Calcium: 8.6 mg/dL — ABNORMAL LOW (ref 8.9–10.3)
GFR calc Af Amer: >60 mL/min (ref >60)
GLUCOSE: 104 mg/dL — AB (ref 65–99)
POTASSIUM: 3.4 mmol/L — AB (ref 3.5–5.1)
Sodium: 133 mmol/L — ABNORMAL LOW (ref 135–145)
Total Bilirubin: 0.6 mg/dL (ref 0.3–1.2)
Total Protein: 6.8 g/dL (ref 6.5–8.1)

## 2016-10-18 LAB — OB RESULTS CONSOLE GC/CHLAMYDIA
Chlamydia: NEGATIVE
Gonorrhea: NEGATIVE

## 2016-10-18 LAB — WET PREP, GENITAL
CLUE CELLS WET PREP: NONE SEEN
SPERM: NONE SEEN
TRICH WET PREP: NONE SEEN
YEAST WET PREP: NONE SEEN

## 2016-10-18 LAB — LIPASE, BLOOD: LIPASE: 24 U/L (ref 11–51)

## 2016-10-18 LAB — OB RESULTS CONSOLE HEPATITIS B SURFACE ANTIGEN: HEP B S AG: NEGATIVE

## 2016-10-18 LAB — OB RESULTS CONSOLE HIV ANTIBODY (ROUTINE TESTING): HIV: NONREACTIVE

## 2016-10-18 LAB — OB RESULTS CONSOLE RPR: RPR: NONREACTIVE

## 2016-10-18 LAB — OB RESULTS CONSOLE RUBELLA ANTIBODY, IGM: RUBELLA: IMMUNE

## 2016-10-18 LAB — OB RESULTS CONSOLE ANTIBODY SCREEN: Antibody Screen: NEGATIVE

## 2016-10-18 LAB — HCG, QUANTITATIVE, PREGNANCY: hCG, Beta Chain, Quant, S: 176169 m[IU]/mL — ABNORMAL HIGH (ref <5)

## 2016-10-18 LAB — ABO/RH: ABO/RH(D): B POS

## 2016-10-18 LAB — OB RESULTS CONSOLE ABO/RH: RH Type: POSITIVE

## 2016-10-18 MED ORDER — SODIUM CHLORIDE 0.9 % IV BOLUS (SEPSIS)
1000.0000 mL | Freq: Once | INTRAVENOUS | Status: AC
  Administered 2016-10-18: 1000 mL via INTRAVENOUS

## 2016-10-18 MED ORDER — DOXYLAMINE-PYRIDOXINE 10-10 MG PO TBEC: 1.0000 | DELAYED_RELEASE_TABLET | Freq: Two times a day (BID) | ORAL | 0 refills | Status: DC | PRN

## 2016-10-18 NOTE — ED Triage Notes (Addendum)
Pt presents with c/o left side abdominal pain that she reports started 6-7 weeks ago and has been off and on since then. Pt reports she is approx [redacted] weeks pregnant and reports that within the past week, she has had a pain in her upper left quadrant. Pt reports that on Thanksgiving, she had a sharp pain in the center of her abdomen, has subsided at this time. Pt also reports N/V/D.

## 2016-10-18 NOTE — Discharge Instructions (Addendum)
Diclegis as needed for nausea/vomiting.  Increase hydration.  Continue pre-natal vitamins.  Please follow up with the OBGYN for prenatal care.  Return to ER for new or worsening symptoms, any additional concerns.

## 2016-10-18 NOTE — ED Notes (Signed)
Bed: WA11 Expected date:  Expected time:  Means of arrival:  Comments: 

## 2016-10-18 NOTE — ED Provider Notes (Signed)
[facility]-EMERGENCY DEPT Provider Note   CSN: [id number] Arrival date & time: 10/18/16  [id number]     History   Chief Complaint Chief Complaint  Patient presents with  . Abdominal Pain    HPI Diana Maxwell is a 20 y.o. female.  The history is provided by the patient and medical records. No language interpreter was used.   Diana Maxwell is a 20 y.o. female G1P0 at 9 weeks by LMP who presents to the Emergency Department complaining of intermittent left-sided abdominal pain x 1 week described as a "mild discomfort". Shortly after thanksgiving dinner she had central upper abdominal pain for a few seconds which quickly resolved. Nausea and emesis in the mornings a few times a week but none today. No vaginal bleeding or spotting, no vaginal discharge, no dysuria or hesitancy. No chest pain, shortness of breath or back pain. She started taking prenatal vitamins but has not made it to her initial OB/GYN appointment. She states that she has an appointment with health department in early December.   History reviewed. No pertinent past medical history.  There are no active problems to display for this patient.   History reviewed. No pertinent surgical history.  OB History    Gravida Para Term Preterm AB Living   1             SAB TAB Ectopic Multiple Live Births                   Home Medications    Prior to Admission medications   Medication Sig Start Date End Date Taking? Authorizing Provider  Prenatal Vit-Fe Fumarate-FA (PRENATAL/FOLIC ACID PO) Take 1 tablet by mouth daily.   Yes Historical Provider, MD  Doxylamine-Pyridoxine (DICLEGIS) 10-10 MG TBEC Take 1 tablet by mouth 2 (two) times daily as needed. 10/18/16   Chase PicketJaime Pilcher Ward, PA-C    Family History No family history on file.  Social History Social History  Substance Use Topics  . Smoking status: Former Games developermoker  . Smokeless tobacco: Never Used  . Alcohol use No     Allergies   Pineapple   Review of Systems Review of  Systems  Constitutional: Negative for chills and fever.  HENT: Negative for congestion.   Eyes: Negative for visual disturbance.  Respiratory: Negative for cough and shortness of breath.   Cardiovascular: Negative.   Gastrointestinal: Positive for abdominal pain, nausea and vomiting. Negative for blood in stool, constipation and diarrhea.  Genitourinary: Negative for dysuria, frequency and urgency.  Musculoskeletal: Negative for back pain and neck pain.  Skin: Negative for rash.  Neurological: Negative for headaches.     Physical Exam Updated Vital Signs BP 119/61 (BP Location: Right Arm)   Pulse 63   Temp 98.2 F (36.8 C)   Resp 16   Ht 5\' 11"  (1.803 m)   Wt 83.9 kg   LMP 08/10/2016 (Approximate)   SpO2 100%   BMI 25.80 kg/m   Physical Exam  Constitutional: She is oriented to person, place, and time. She appears well-developed and well-nourished. No distress.  HENT:  Head: Normocephalic and atraumatic.  Cardiovascular: Normal rate, regular rhythm and normal heart sounds.   No murmur heard. Pulmonary/Chest: Effort normal and breath sounds normal. No respiratory distress.  Abdominal: Soft. Bowel sounds are normal. She exhibits no distension. There is no tenderness. There is no rebound and no guarding.  Genitourinary:  Genitourinary Comments: Chaperone present for exam. Cervical os closed with no discharge. No cervical motion tenderness or adnexal  tenderness. No vaginal bleeding.   Neurological: She is alert and oriented to person, place, and time.  Skin: Skin is warm and dry.  Nursing note and vitals reviewed.    ED Treatments / Results  Labs (all labs ordered are listed, but only abnormal results are displayed) Labs Reviewed  WET PREP, GENITAL - Abnormal; Notable for the following:       Result Value   WBC, Wet Prep HPF POC FEW (*)    All other components within normal limits  COMPREHENSIVE METABOLIC PANEL - Abnormal; Notable for the following:    Sodium 133 (*)      Potassium 3.4 (*)    Glucose, Bld 104 (*)    Calcium 8.6 (*)    All other components within normal limits  CBC WITH DIFFERENTIAL/PLATELET - Abnormal; Notable for the following:    Hemoglobin 11.7 (*)    HCT 32.8 (*)    All other components within normal limits  URINALYSIS, ROUTINE W REFLEX MICROSCOPIC (NOT AT [facility]) - Abnormal; Notable for the following:    APPearance CLOUDY (*)    Leukocytes, UA SMALL (*)    All other components within normal limits  HCG, QUANTITATIVE, PREGNANCY - Abnormal; Notable for the following:    hCG, Beta Chain, Quant, S 176,169 (*)    All other components within normal limits  URINE MICROSCOPIC-ADD ON - Abnormal; Notable for the following:    Squamous Epithelial / LPF TOO NUMEROUS TO COUNT (*)    Bacteria, UA FEW (*)    All other components within normal limits  LIPASE, BLOOD  ABO/RH  GC/CHLAMYDIA PROBE AMP ([place]) NOT AT [facility]    EKG  EKG Interpretation None       Radiology US Ob Comp Less 14 Wks  Result Date: 10/18/2016 CLINICAL DATA:  Pelvic pain for 1 week EXAM: OBSTETRIC <14 WK [place] AND TRANSVAGINAL OB US DOPPLER ULTRASOUND OF OVARIES TECHNIQUE: Both transabdominal and transvaginal ultrasound examinations were performed for complete evaluation of the gestation as well as the maternal uterus, adnexal regions, and pelvic cul-de-sac. Transvaginal technique was performed to assess early pregnancy. Color and duplex Doppler ultrasound was utilized to evaluate blood flow to the ovaries. COMPARISON:  None. FINDINGS: Intrauterine gestational sac: Single Yolk sac:  Present Embryo:  Present Cardiac Activity: Present Heart Rate: 171 bpm CRL:   22  mm   8 w 6 d                  [place] EDC: 05/24/2017 Subchorionic hemorrhage:  Small subchorionic hemorrhage is noted. Maternal uterus/adnexae: 4.1 cm left ovarian cyst is noted. It is simple in nature. The right ovary is within normal limits. No free fluid is seen. Pulsed Doppler evaluation of both ovaries  demonstrates normal appearing low-resistance arterial and venous waveforms. IMPRESSION: Single live intrauterine gestation at 8 weeks 6 days. Electronically Signed   By: Alcide Clever M.D.   On: 10/18/2016 12:13   US Ob Transvaginal  Result Date: 10/18/2016 CLINICAL DATA:  Pelvic pain for 1 week EXAM: OBSTETRIC <14 WK [place] AND TRANSVAGINAL OB US DOPPLER ULTRASOUND OF OVARIES TECHNIQUE: Both transabdominal and transvaginal ultrasound examinations were performed for complete evaluation of the gestation as well as the maternal uterus, adnexal regions, and pelvic cul-de-sac. Transvaginal technique was performed to assess early pregnancy. Color and duplex Doppler ultrasound was utilized to evaluate blood flow to the ovaries. COMPARISON:  None. FINDINGS: Intrauterine gestational sac: Single Yolk sac:  Present Embryo:  Present Cardiac Activity: Present  Heart Rate: 171 bpm CRL:   22  mm   8 w 6 d                  [place] EDC: 05/24/2017 Subchorionic hemorrhage:  Small subchorionic hemorrhage is noted. Maternal uterus/adnexae: 4.1 cm left ovarian cyst is noted. It is simple in nature. The right ovary is within normal limits. No free fluid is seen. Pulsed Doppler evaluation of both ovaries demonstrates normal appearing low-resistance arterial and venous waveforms. IMPRESSION: Single live intrauterine gestation at 8 weeks 6 days. Electronically Signed   By: Alcide Clever M.D.   On: 10/18/2016 12:13   [place] Art/ven Flow Abd Pelv Doppler  Result Date: 10/18/2016 CLINICAL DATA:  Pelvic pain for 1 week EXAM: OBSTETRIC <14 WK [place] AND TRANSVAGINAL OB US DOPPLER ULTRASOUND OF OVARIES TECHNIQUE: Both transabdominal and transvaginal ultrasound examinations were performed for complete evaluation of the gestation as well as the maternal uterus, adnexal regions, and pelvic cul-de-sac. Transvaginal technique was performed to assess early pregnancy. Color and duplex Doppler ultrasound was utilized to evaluate blood flow to the ovaries.  COMPARISON:  None. FINDINGS: Intrauterine gestational sac: Single Yolk sac:  Present Embryo:  Present Cardiac Activity: Present Heart Rate: 171 bpm CRL:   22  mm   8 w 6 d                  [place] EDC: 05/24/2017 Subchorionic hemorrhage:  Small subchorionic hemorrhage is noted. Maternal uterus/adnexae: 4.1 cm left ovarian cyst is noted. It is simple in nature. The right ovary is within normal limits. No free fluid is seen. Pulsed Doppler evaluation of both ovaries demonstrates normal appearing low-resistance arterial and venous waveforms. IMPRESSION: Single live intrauterine gestation at 8 weeks 6 days. Electronically Signed   By: Alcide Clever M.D.   On: 10/18/2016 12:13    Procedures Procedures (including critical care time)  Medications Ordered in ED Medications  sodium chloride 0.9 % bolus 1,000 mL (0 mLs Intravenous Stopped 10/18/16 1252)     Initial Impression / Assessment and Plan / ED Course  I have reviewed the triage vital signs and the nursing notes.  Pertinent labs & imaging results that were available during my care of the patient were reviewed by me and considered in my medical decision making (see chart for details).  Clinical Course    Diana Maxwell is a 20 y.o. female who presents to ED for intermittent left-sided abdominal pain x 1 week which is described as a "mild discomfort". Patient is [redacted] weeks pregnant. No vaginal discharge or bleeding. No urinary symptoms. Pelvic exam with closed cervical os and no bleeding or discharge. Labs reviewed and reassuring. Ultrasound shows single live IUP at 8 weeks, 6 days. Evaluation does not show pathology that would require ongoing emergent intervention or inpatient treatment. Rx for diclegis for morning sickness given. Women's outpatient clinic information provided if needed, although patient does have initial prenatal appt. In December with the health department. Reasons to return to ER discussed and all questions answered.    Final Clinical  Impressions(s) / ED Diagnoses   Final diagnoses:  Abdominal pain  Abdominal pain during pregnancy in first trimester    New Prescriptions Discharge Medication List as of 10/18/2016 12:40 PM    START taking these medications   Details  Doxylamine-Pyridoxine (DICLEGIS) 10-10 MG TBEC Take 1 tablet by mouth 2 (two) times daily as needed., Starting Sun 10/18/2016, Print         [employer]  Ward, PA-C 10/18/16 1327    Lorre NickAnthony Allen, MD 10/18/16 [phone]

## 2016-10-21 ENCOUNTER — Emergency Department (HOSPITAL_COMMUNITY)
Admission: EM | Admit: 2016-10-21 | Discharge: 2016-10-21 | Disposition: A | Discharge status: Home / Self Care | Payer: Medicaid Other | Attending: Emergency Medicine | Admitting: Emergency Medicine

## 2016-10-21 ENCOUNTER — Encounter (HOSPITAL_COMMUNITY): Payer: Self-pay | Admitting: Emergency Medicine

## 2016-10-21 DIAGNOSIS — Z79899 Other long term (current) drug therapy: Secondary | ICD-10-CM | POA: Diagnosis not present

## 2016-10-21 DIAGNOSIS — O210 Mild hyperemesis gravidarum: Secondary | ICD-10-CM

## 2016-10-21 DIAGNOSIS — Z3A09 9 weeks gestation of pregnancy: Secondary | ICD-10-CM | POA: Diagnosis not present

## 2016-10-21 DIAGNOSIS — O219 Vomiting of pregnancy, unspecified: Secondary | ICD-10-CM | POA: Diagnosis not present

## 2016-10-21 DIAGNOSIS — Z87891 Personal history of nicotine dependence: Secondary | ICD-10-CM | POA: Diagnosis not present

## 2016-10-21 LAB — COMPREHENSIVE METABOLIC PANEL
ALT: 16 U/L (ref 14–54)
ANION GAP: 6 (ref 5–15)
AST: 20 U/L (ref 15–41)
Albumin: 4 g/dL (ref 3.5–5.0)
Alkaline Phosphatase: 58 U/L (ref 38–126)
BUN: 7 mg/dL (ref 6–20)
CHLORIDE: 103 mmol/L (ref 101–111)
CO2: 25 mmol/L (ref 22–32)
CREATININE: 0.8 mg/dL (ref 0.44–1.00)
Calcium: 8.9 mg/dL (ref 8.9–10.3)
Glucose, Bld: 89 mg/dL (ref 65–99)
POTASSIUM: 3.4 mmol/L — AB (ref 3.5–5.1)
SODIUM: 134 mmol/L — AB (ref 135–145)
Total Bilirubin: 0.6 mg/dL (ref 0.3–1.2)
Total Protein: 7.2 g/dL (ref 6.5–8.1)

## 2016-10-21 LAB — URINALYSIS, ROUTINE W REFLEX MICROSCOPIC
Bilirubin Urine: NEGATIVE
GLUCOSE, UA: NEGATIVE mg/dL
HGB URINE DIPSTICK: NEGATIVE
Ketones, ur: NEGATIVE mg/dL
Nitrite: NEGATIVE
PROTEIN: NEGATIVE mg/dL
SPECIFIC GRAVITY, URINE: 1.024 (ref 1.005–1.030)
pH: 7 (ref 5.0–8.0)

## 2016-10-21 LAB — CBC
HEMATOCRIT: 32 % — AB (ref 36.0–46.0)
HEMOGLOBIN: 11.3 g/dL — AB (ref 12.0–15.0)
MCH: 29.2 pg (ref 26.0–34.0)
MCHC: 35.3 g/dL (ref 30.0–36.0)
MCV: 82.7 fL (ref 78.0–100.0)
Platelets: 238 10*3/uL (ref 150–400)
RBC: 3.87 MIL/uL (ref 3.87–5.11)
RDW: 12.8 % (ref 11.5–15.5)
WBC: 7 10*3/uL (ref 4.0–10.5)

## 2016-10-21 LAB — URINE MICROSCOPIC-ADD ON: RBC / HPF: NONE SEEN RBC/hpf (ref 0–5)

## 2016-10-21 LAB — GC/CHLAMYDIA PROBE AMP (~~LOC~~) NOT AT ARMC
Chlamydia: NEGATIVE
NEISSERIA GONORRHEA: NEGATIVE

## 2016-10-21 LAB — I-STAT BETA HCG BLOOD, ED (MC, WL, AP ONLY): I-stat hCG, quantitative: >2000 m[IU]/mL — ABNORMAL HIGH (ref <5)

## 2016-10-21 LAB — LIPASE, BLOOD: LIPASE: 25 U/L (ref 11–51)

## 2016-10-21 NOTE — ED Notes (Signed)
ED Provider at bedside. 

## 2016-10-21 NOTE — Discharge Instructions (Signed)
You may try the following techniques and medications for the management of nausea and vomiting in pregnancy:  1. General management: See further details below. An eating plan for hyperemesis gravidarum has been included in the discharge paperwork. You do not have evidence of this condition at present, but the eating plan may be helpful regardless. 2. Vitamin B6 (Pyridoxine): Take 10-25 mg orally every 6-8 hours. OR 3. Ginger: Take 250 mg orally 4 times a day.  4. Diclegis: You may also try this medication that was previously prescribed to you. 5. OBGYN: Follow up with OBGYN on this issue for further assessment and continued management.     Meals and snacks -- Women with nausea should eat before, or as soon as, they feel hungry to avoid an empty stomach, which can aggravate nausea. A snack before getting out of bed in the morning and snacks during the night (eg, crackers with peanut butter or cheese taken prior to bathroom trips) may be helpful. Meals and snacks should be eaten slowly and in small amounts every one to two hours to avoid a full stomach, which can also aggravate nausea. Women should determine what foods they tolerate best and try to eat those foods. Dietary manipulations that help some women include eliminating coffee and spicy, odorous, high-fat, acidic, and very sweet foods, and substituting snacks/meals that are protein-dominant, salty, low-fat, bland, and/or dry (eg, nuts, pretzels, crackers, cereal, toast). Drinking peppermint tea or sucking peppermint candies may reduce postprandial nausea.  However, high-quality evidence of the optimal dietary components to reduce nausea are sparse. Although clinicians commonly recommend ingestion of frequent, small, carbohydrate-predominant meals/snacks, such as soda crackers or dry toast, based primarily on anecdotal evidence passed down over a century, consumption of protein-predominant meals/snacks may be more helpful and was associated with  quantifiable decreases in nausea in one study. Women whose symptoms are related to delayed gastric emptying should improve with a diet consisting of low-fat solids and liquids since these foods are more readily emptied by the stomach; however, it is not known to what degree gastric emptying and dysfunction account for symptoms in women with nausea and vomiting of pregnancy. Fluids -- Fluids should be consumed at least 30 minutes before or after solid food to minimize the effect of a full stomach. Fluids are better tolerated if cold, clear, and carbonated or sour (eg, ginger ale, lemonade, popsicles) and taken in small amounts; using a straw sometimes helps. Some women find aromatic liquids, such as lemon or mint tea, more tolerable. Small volumes of electrolyte-replacement sports drinks can be used to replace both fluids and electrolytes, if tolerated. Avoidance of triggers -- Along with dietary changes, avoidance of environmental triggers is a key intervention for reducing nausea and vomiting of pregnancy. Examples of some triggers include stuffy rooms, odors (eg, perfume, chemicals, food, smoke), heat, humidity, noise, and visual or physical motion (eg, flickering lights, driving). Quickly changing position and not getting enough rest, particularly after eating, may also aggravate symptoms . Lying down soon after eating and lying on the left side are additional potentially aggravating factors because these actions may delay gastric emptying. Cold solid foods are tolerated better than hot solid foods because they have less odor and require less preparation time (ie, shorter exposure to the trigger if the woman is preparing her own meal). Brushing teeth after a meal, spitting out saliva, and frequently washing out the mouth can also be helpful.

## 2016-10-21 NOTE — ED Notes (Signed)
Patient given gingerale and saltine crackers for PO challenge  

## 2016-10-21 NOTE — ED Triage Notes (Signed)
Patient reports she is [redacted] weeks pregnant and has been having "problems with morning sickness." Patient reports one episode of vomiting with bright red blood this morning. Patient also reports two episodes of diarrhea. Denies abdominal pain.

## 2016-10-21 NOTE — Progress Notes (Signed)
Medicaid [place] access response hx indicates the assigned pcp is [facility] [address] [place] [phone]

## 2016-10-21 NOTE — ED Provider Notes (Signed)
[facility]-EMERGENCY DEPT Provider Note   CSN: [id number] Arrival date & time: 10/21/16  1404     History   Chief Complaint Chief Complaint  Patient presents with  . Emesis    HPI Diana Maxwell is a 20 y.o. female.  HPI   Diana Maxwell is a 20 y.o. female, with a history of G1P0, presenting to the ED with Nausea and vomiting over the last several weeks. Patient is currently [redacted] weeks pregnant with her first pregnancy. Endorses 2 episodes of emesis over the last 24 hours. Patient also endorses some retching in between. She was concerned today because during her most recent episode of emesis, she found streaks of blood in the vomit. She has not vomited since this time. Patient has been maintaining hydration with Pedialyte and popsicles. Patient denies fever/chills, diarrhea, abdominal pain, vaginal bleeding, or any other complaints.      History reviewed. No pertinent past medical history.  There are no active problems to display for this patient.   History reviewed. No pertinent surgical history.  OB History    Gravida Para Term Preterm AB Living   1             SAB TAB Ectopic Multiple Live Births                   Home Medications    Prior to Admission medications   Medication Sig Start Date End Date Taking? Authorizing Provider  acetaminophen (TYLENOL) 325 MG tablet Take 650 mg by mouth every 6 (six) hours as needed (pain).   Yes Historical Provider, MD  Prenatal Vit-Fe Fumarate-FA (PRENATAL/FOLIC ACID PO) Take 1 tablet by mouth daily.   Yes Historical Provider, MD  Doxylamine-Pyridoxine (DICLEGIS) 10-10 MG TBEC Take 1 tablet by mouth 2 (two) times daily as needed. 10/18/16   Chase PicketJaime Pilcher Ward, PA-C    Family History History reviewed. No pertinent family history.  Social History Social History  Substance Use Topics  . Smoking status: Former Games developermoker  . Smokeless tobacco: Never Used  . Alcohol use No     Allergies   Pineapple   Review of Systems Review of  Systems  Constitutional: Negative for chills and fever.  Gastrointestinal: Positive for nausea and vomiting. Negative for abdominal pain and diarrhea.  Genitourinary: Negative for dysuria, flank pain and hematuria.  Neurological: Negative for dizziness, syncope, weakness, light-headedness, numbness and headaches.  All other systems reviewed and are negative.    Physical Exam Updated Vital Signs BP (!) 109/53   Pulse 81   Temp 98.3 F (36.8 C) (Oral)   Resp 18   Ht 5\' 11"  (1.803 m)   Wt 83.9 kg   LMP 08/10/2016 (Approximate)   SpO2 100%   BMI 25.80 kg/m   Physical Exam  Constitutional: She appears well-developed and well-nourished. No distress.  HENT:  Head: Normocephalic and atraumatic.  Eyes: Conjunctivae are normal.  Neck: Neck supple.  Cardiovascular: Normal rate, regular rhythm, normal heart sounds and intact distal pulses.   Pulmonary/Chest: Effort normal and breath sounds normal. No respiratory distress.  Abdominal: Soft. There is no tenderness. There is no guarding.  Musculoskeletal: She exhibits no edema.  Lymphadenopathy:    She has no cervical adenopathy.  Neurological: She is alert.  Skin: Skin is warm and dry. She is not diaphoretic.  Psychiatric: She has a normal mood and affect. Her behavior is normal.  Nursing note and vitals reviewed.    ED Treatments / Results  Labs (all labs ordered  are listed, but only abnormal results are displayed) Labs Reviewed  COMPREHENSIVE METABOLIC PANEL - Abnormal; Notable for the following:       Result Value   Sodium 134 (*)    Potassium 3.4 (*)    All other components within normal limits  CBC - Abnormal; Notable for the following:    Hemoglobin 11.3 (*)    HCT 32.0 (*)    All other components within normal limits  URINALYSIS, ROUTINE W REFLEX MICROSCOPIC (NOT AT [facility]) - Abnormal; Notable for the following:    APPearance CLOUDY (*)    Leukocytes, UA SMALL (*)    All other components within normal limits  URINE  MICROSCOPIC-ADD ON - Abnormal; Notable for the following:    Squamous Epithelial / LPF 6-30 (*)    Bacteria, UA FEW (*)    All other components within normal limits  I-STAT BETA HCG BLOOD, ED ([facility], [facility], [facility] ONLY) - Abnormal; Notable for the following:    I-stat hCG, quantitative >2,000.0 (*)    All other components within normal limits  URINE CULTURE  LIPASE, BLOOD    Hemoglobin  Date Value Ref Range Status  10/21/2016 11.3 (L) 12.0 - 15.0 g/dL Final  16/10/960411/26/2017 54.011.7 (L) 12.0 - 15.0 g/dL Final  98/11/914712/27/2016 82.912.8 12.0 - 15.0 g/dL Final     EKG  EKG Interpretation None       Radiology No results found.  Procedures Procedures (including critical care time)  Medications Ordered in ED Medications - No data to display   Initial Impression / Assessment and Plan / ED Course  I have reviewed the triage vital signs and the nursing notes.  Pertinent labs & imaging results that were available during my care of the patient were reviewed by me and considered in my medical decision making (see chart for details).  Clinical Course as of Oct 21 2009  Wed Oct 21, 2016  1836 Likely due to contamination in light of 6-30 squamous epithelial cells.  Bacteria, UA: (!) FEW [SJ]    Clinical Course User Index [SJ] Anselm PancoastShawn C Calea Hribar, PA-C    Patient presents with nausea and vomiting consistent with morning sickness in pregnancy. Patient's presentation is not consistent with hyperemesis gravidarum at this point. She is nontoxic appearing, not tachycardic, not hypertensive, and is in no apparent distress. No significant abnormalities on the patient's labs. Patient was able to readily pass an oral fluid challenge. OB/GYN follow-up. Patient advised to speak with OB/GYN for [facility] should symptoms worsen.   Vitals:   10/21/16 1424 10/21/16 1425 10/21/16 1713 10/21/16 1927  BP: 101/66  (!) 109/53 104/62  Pulse: 83  81 60  Resp: 18  18 18   Temp: 98.3 F (36.8 C)   98.8 F (37.1  C)  TempSrc: Oral   Oral  SpO2: 100%  100% 100%  Weight:  83.9 kg    Height:  5\' 11"  (1.803 m)        Final Clinical Impressions(s) / ED Diagnoses   Final diagnoses:  Morning sickness    New Prescriptions Discharge Medication List as of 10/21/2016  7:18 PM       Anselm PancoastShawn C Marrisa Kimber, PA-C 10/21/16 2010    Azalia BilisKevin Campos, MD 10/22/16 [phone]

## 2016-10-23 LAB — URINE CULTURE

## 2016-10-29 ENCOUNTER — Other Ambulatory Visit (HOSPITAL_COMMUNITY): Payer: Self-pay | Admitting: Nurse Practitioner

## 2016-10-29 DIAGNOSIS — Z3A12 12 weeks gestation of pregnancy: Secondary | ICD-10-CM

## 2016-10-29 DIAGNOSIS — O351XX0 Maternal care for (suspected) chromosomal abnormality in fetus, not applicable or unspecified: Secondary | ICD-10-CM

## 2016-10-29 DIAGNOSIS — IMO0002 Reserved for concepts with insufficient information to code with codable children: Secondary | ICD-10-CM

## 2016-11-03 ENCOUNTER — Ambulatory Visit (HOSPITAL_COMMUNITY): Admission: RE | Admit: 2016-11-03 | Payer: Medicaid Other | Source: Ambulatory Visit

## 2016-11-03 ENCOUNTER — Other Ambulatory Visit (HOSPITAL_COMMUNITY): Payer: Self-pay | Admitting: *Deleted

## 2016-11-03 ENCOUNTER — Other Ambulatory Visit (HOSPITAL_COMMUNITY): Payer: Self-pay | Admitting: Nurse Practitioner

## 2016-11-03 ENCOUNTER — Ambulatory Visit (HOSPITAL_COMMUNITY)
Admission: RE | Admit: 2016-11-03 | Discharge: 2016-11-03 | Disposition: A | Discharge status: Home / Self Care | Payer: Medicaid Other | Source: Ambulatory Visit | Attending: Nurse Practitioner | Admitting: Nurse Practitioner

## 2016-11-03 ENCOUNTER — Encounter (HOSPITAL_COMMUNITY): Payer: Self-pay

## 2016-11-03 DIAGNOSIS — Z3A12 12 weeks gestation of pregnancy: Secondary | ICD-10-CM

## 2016-11-03 DIAGNOSIS — IMO0002 Reserved for concepts with insufficient information to code with codable children: Secondary | ICD-10-CM

## 2016-11-03 DIAGNOSIS — O351XX0 Maternal care for (suspected) chromosomal abnormality in fetus, not applicable or unspecified: Secondary | ICD-10-CM

## 2016-11-03 DIAGNOSIS — Z3682 Encounter for antenatal screening for nuchal translucency: Secondary | ICD-10-CM

## 2016-11-03 DIAGNOSIS — Z3A11 11 weeks gestation of pregnancy: Secondary | ICD-10-CM | POA: Diagnosis not present

## 2016-11-03 DIAGNOSIS — N939 Abnormal uterine and vaginal bleeding, unspecified: Secondary | ICD-10-CM

## 2016-11-03 DIAGNOSIS — O4691 Antepartum hemorrhage, unspecified, first trimester: Secondary | ICD-10-CM | POA: Insufficient documentation

## 2016-11-03 HISTORY — DX: Gonococcal infection, unspecified: A54.9

## 2016-11-17 ENCOUNTER — Ambulatory Visit (HOSPITAL_COMMUNITY): Admission: RE | Admit: 2016-11-17 | Payer: Medicaid Other | Source: Ambulatory Visit

## 2016-11-17 ENCOUNTER — Ambulatory Visit (HOSPITAL_COMMUNITY)
Admission: RE | Admit: 2016-11-17 | Discharge: 2016-11-17 | Disposition: A | Discharge status: Home / Self Care | Payer: Medicaid Other | Source: Ambulatory Visit | Attending: Nurse Practitioner | Admitting: Nurse Practitioner

## 2016-11-17 ENCOUNTER — Other Ambulatory Visit (HOSPITAL_COMMUNITY): Payer: Self-pay | Admitting: Maternal and Fetal Medicine

## 2016-11-17 ENCOUNTER — Encounter (HOSPITAL_COMMUNITY): Payer: Self-pay

## 2016-11-17 DIAGNOSIS — Z3682 Encounter for antenatal screening for nuchal translucency: Secondary | ICD-10-CM

## 2016-11-17 DIAGNOSIS — Z3A13 13 weeks gestation of pregnancy: Secondary | ICD-10-CM | POA: Insufficient documentation

## 2016-11-18 ENCOUNTER — Other Ambulatory Visit (HOSPITAL_COMMUNITY): Payer: Self-pay | Admitting: *Deleted

## 2016-11-18 DIAGNOSIS — Z3682 Encounter for antenatal screening for nuchal translucency: Secondary | ICD-10-CM

## 2016-11-20 ENCOUNTER — Ambulatory Visit (HOSPITAL_COMMUNITY)
Admission: RE | Admit: 2016-11-20 | Discharge: 2016-11-20 | Disposition: A | Discharge status: Home / Self Care | Payer: Medicaid Other | Source: Ambulatory Visit | Attending: Nurse Practitioner | Admitting: Nurse Practitioner

## 2016-11-20 DIAGNOSIS — Z3682 Encounter for antenatal screening for nuchal translucency: Secondary | ICD-10-CM | POA: Diagnosis present

## 2016-11-20 DIAGNOSIS — Z3A13 13 weeks gestation of pregnancy: Secondary | ICD-10-CM | POA: Diagnosis not present

## 2016-11-23 NOTE — L&D Delivery Note (Signed)
21 y.o. G1P0 at 4148w1d delivered a viable female infant in cephalic, OP position. Anterior shoulder delivered with ease. 60 sec delayed cord clamping. Cord clamped x2 and cut. Placenta delivered spontaneously intact, with 3VC. Fundus firm on exam with massage and pitocin. Good hemostasis noted.  Methergine 0.2 mg injection was given for postpartum bleeding with clots expressed. Uterine fundus was massaged and noted to be firm.   Anesthesia: Epidural Laceration: 2nd degree perineal Suture: 3.0 Vicryl Good hemostasis noted. EBL: 500 cc  Mom and baby recovering in LDR.    Apgars: APGAR (1 MIN): 8   APGAR (5 MINS): 9   APGAR (10 MINS):   Weight: Pending skin to skin  Sponge and instrument count were correct x2. Placenta sent to L&D.  Howard PouchLauren Rody Keadle, MD PGY-2 Family Medicine 06/01/2017, 4:59 AM

## 2016-11-27 ENCOUNTER — Other Ambulatory Visit: Payer: Self-pay | Admitting: Nurse Practitioner

## 2017-02-09 ENCOUNTER — Encounter (HOSPITAL_COMMUNITY): Payer: Self-pay | Admitting: *Deleted

## 2017-02-09 ENCOUNTER — Inpatient Hospital Stay (HOSPITAL_COMMUNITY)
Admission: AD | Admit: 2017-02-09 | Discharge: 2017-02-09 | Disposition: A | Discharge status: Home / Self Care | Payer: Medicaid Other | Source: Ambulatory Visit | Attending: Family Medicine | Admitting: Family Medicine

## 2017-02-09 DIAGNOSIS — O26892 Other specified pregnancy related conditions, second trimester: Secondary | ICD-10-CM | POA: Insufficient documentation

## 2017-02-09 DIAGNOSIS — Z87891 Personal history of nicotine dependence: Secondary | ICD-10-CM | POA: Diagnosis not present

## 2017-02-09 DIAGNOSIS — O26899 Other specified pregnancy related conditions, unspecified trimester: Secondary | ICD-10-CM

## 2017-02-09 DIAGNOSIS — R109 Unspecified abdominal pain: Secondary | ICD-10-CM | POA: Diagnosis present

## 2017-02-09 DIAGNOSIS — Z3A25 25 weeks gestation of pregnancy: Secondary | ICD-10-CM | POA: Insufficient documentation

## 2017-02-09 DIAGNOSIS — R102 Pelvic and perineal pain: Secondary | ICD-10-CM | POA: Diagnosis not present

## 2017-02-09 LAB — URINALYSIS, ROUTINE W REFLEX MICROSCOPIC
Bilirubin Urine: NEGATIVE
GLUCOSE, UA: NEGATIVE mg/dL
Hgb urine dipstick: NEGATIVE
Ketones, ur: NEGATIVE mg/dL
NITRITE: NEGATIVE
PH: 7 (ref 5.0–8.0)
Protein, ur: NEGATIVE mg/dL
SPECIFIC GRAVITY, URINE: 1.01 (ref 1.005–1.030)

## 2017-02-09 MED ORDER — COMFORT FIT MATERNITY SUPP SM MISC: 1.0000 [IU] | Freq: Every day | 0 refills | Status: DC | PRN

## 2017-02-09 NOTE — MAU Note (Signed)
Pt presents complaining of lower abdominal pain that feels like increased pressure. States it is worse along the bottom and sides of her belly and worse with movement. Denies leaking or bleeding. Reports good fetal movement. Denies pain at this time. Only with movement.

## 2017-02-09 NOTE — MAU Provider Note (Signed)
History     CSN: [id number]  Arrival date and time: 02/09/17 2156  First Provider Initiated Contact with Patient 02/09/17 2259      Chief Complaint  Patient presents with  . Abdominal Pain   HPI Diana Maxwell is a 21 y.o. G1P0 at 1249w2d who presents with abdominal pain. Reports intermittent abdominal pain for the last week. Pain worse with position changes & while driving. Denies vaginal bleeding, LOF, fever, dysuria, or vaginal discharge. Had intercourse earlier today. Positive fetal movement.  OB History    Gravida Para Term Preterm AB Living   1         0   SAB TAB Ectopic Multiple Live Births                  Past Medical History:  Diagnosis Date  . Gonorrhea 2016    Past Surgical History:  Procedure Laterality Date  . NO PAST SURGERIES      History reviewed. No pertinent family history.  Social History  Substance Use Topics  . Smoking status: Former Smoker    Packs/day: 0.25    Types: Cigarettes    Quit date: 05/04/2014  . Smokeless tobacco: Never Used  . Alcohol use No    Allergies:  Allergies  Allergen Reactions  . Pineapple Anaphylaxis    Prescriptions Prior to Admission  Medication Sig Dispense Refill Last Dose  . Prenatal Vit-Fe Fumarate-FA (PRENATAL/FOLIC ACID PO) Take 1 tablet by mouth daily.   02/09/2017 at Unknown time  . Doxylamine-Pyridoxine (DICLEGIS) 10-10 MG TBEC Take 1 tablet by mouth 2 (two) times daily as needed. (Patient not taking: Reported on 11/17/2016) 30 tablet 0 Not Taking at Unknown time    Review of Systems  Constitutional: Negative.   Gastrointestinal: Positive for abdominal pain. Negative for constipation, diarrhea, nausea and vomiting.  Genitourinary: Negative for dyspareunia, vaginal bleeding and vaginal discharge.   Physical Exam   Blood pressure 115/69, pulse 86, temperature 98.2 F (36.8 C), temperature source Oral, resp. rate 18, last menstrual period 08/10/2016.  Physical Exam  Nursing note and vitals  reviewed. Constitutional: She is oriented to person, place, and time. She appears well-developed and well-nourished. No distress.  HENT:  Head: Normocephalic and atraumatic.  Eyes: Conjunctivae are normal. Right eye exhibits no discharge. Left eye exhibits no discharge. No scleral icterus.  Neck: Normal range of motion.  Cardiovascular: Normal rate, regular rhythm and normal heart sounds.   No murmur heard. Respiratory: Effort normal and breath sounds normal. No respiratory distress. She has no wheezes.  GI: Soft. Bowel sounds are normal. There is no tenderness.  Neurological: She is alert and oriented to person, place, and time.  Skin: Skin is warm and dry. She is not diaphoretic.  Psychiatric: She has a normal mood and affect. Her behavior is normal. Judgment and thought content normal.   Dilation: Closed Effacement (%): Thick Exam by:: Pollyann Roa L., np  Fetal Tracing:  Baseline: 150 Variability: moderate Accelerations: 10x10 Decelerations: few small variables  Toco: none MAU Course  Procedures Results for orders placed or performed during the hospital encounter of 02/09/17 (from the past 24 hour(s))  Urinalysis, Routine w reflex microscopic     Status: Abnormal   Collection Time: 02/09/17 10:00 PM  Result Value Ref Range   Color, Urine YELLOW YELLOW   APPearance HAZY (A) CLEAR   Specific Gravity, Urine 1.010 1.005 - 1.030   pH 7.0 5.0 - 8.0   Glucose, UA NEGATIVE NEGATIVE mg/dL   Hgb  urine dipstick NEGATIVE NEGATIVE   Bilirubin Urine NEGATIVE NEGATIVE   Ketones, ur NEGATIVE NEGATIVE mg/dL   Protein, ur NEGATIVE NEGATIVE mg/dL   Nitrite NEGATIVE NEGATIVE   Leukocytes, UA LARGE (A) NEGATIVE   RBC / HPF 0-5 0 - 5 RBC/hpf   WBC, UA 0-5 0 - 5 WBC/hpf   Bacteria, UA RARE (A) NONE SEEN   Squamous Epithelial / LPF 6-30 (A) NONE SEEN   Mucous PRESENT    Non Squamous Epithelial 0-5 (A) NONE SEEN    MDM Fetal tracing appropriate for gestation No contractions on toco or  palpated Unable to collect FFN d/t recent intercourse Cervix closed  Assessment and Plan  A; 1. Pain of round ligament affecting pregnancy, antepartum    P: Discharge home Preterm labor precautions & reasons to return to MAU Keep f/u with OB Rx maternity support belt  Judeth Horn 02/09/2017, 10:58 PM

## 2017-02-09 NOTE — Discharge Instructions (Signed)
Round Ligament Pain The round ligament is a cord of muscle and tissue that helps to support the uterus. It can become a source of pain during pregnancy if it becomes stretched or twisted as the baby grows. The pain usually begins in the second trimester of pregnancy, and it can come and go until the baby is delivered. It is not a serious problem, and it does not cause harm to the baby. Round ligament pain is usually a short, sharp, and pinching pain, but it can also be a dull, lingering, and aching pain. The pain is felt in the lower side of the abdomen or in the groin. It usually starts deep in the groin and moves up to the outside of the hip area. Pain can occur with:  A sudden change in position.  Rolling over in bed.  Coughing or sneezing.  Physical activity. Follow these instructions at home: Watch your condition for any changes. Take these steps to help with your pain:  When the pain starts, relax. Then try:  Sitting down.  Flexing your knees up to your abdomen.  Lying on your side with one pillow under your abdomen and another pillow between your legs.  Sitting in a warm bath for 15-20 minutes or until the pain goes away.  Take over-the-counter and prescription medicines only as told by your health care provider.  Move slowly when you sit and stand.  Avoid long walks if they cause pain.  Stop or lessen your physical activities if they cause pain. Contact a health care provider if:  Your pain does not go away with treatment.  You feel pain in your back that you did not have before.  Your medicine is not helping. Get help right away if:  You develop a fever or chills.  You develop uterine contractions.  You develop vaginal bleeding.  You develop nausea or vomiting.  You develop diarrhea.  You have pain when you urinate. This information is not intended to replace advice given to you by your health care provider. Make sure you discuss any questions you have with  your health care provider. Document Released: 08/18/2008 Document Revised: 04/16/2016 Document Reviewed: 01/16/2015 Elsevier Interactive Patient Education  2017 Elsevier Inc.  

## 2017-03-19 ENCOUNTER — Encounter (HOSPITAL_COMMUNITY): Payer: Self-pay

## 2017-03-19 ENCOUNTER — Inpatient Hospital Stay (HOSPITAL_COMMUNITY)
Admission: AD | Admit: 2017-03-19 | Discharge: 2017-03-19 | Disposition: A | Discharge status: Home / Self Care | Payer: Medicaid Other | Source: Ambulatory Visit | Attending: Obstetrics & Gynecology | Admitting: Obstetrics & Gynecology

## 2017-03-19 DIAGNOSIS — Z87891 Personal history of nicotine dependence: Secondary | ICD-10-CM | POA: Insufficient documentation

## 2017-03-19 DIAGNOSIS — O9989 Other specified diseases and conditions complicating pregnancy, childbirth and the puerperium: Secondary | ICD-10-CM

## 2017-03-19 DIAGNOSIS — O26893 Other specified pregnancy related conditions, third trimester: Secondary | ICD-10-CM | POA: Diagnosis present

## 2017-03-19 DIAGNOSIS — Z3A30 30 weeks gestation of pregnancy: Secondary | ICD-10-CM | POA: Insufficient documentation

## 2017-03-19 DIAGNOSIS — L299 Pruritus, unspecified: Secondary | ICD-10-CM | POA: Diagnosis not present

## 2017-03-19 LAB — COMPREHENSIVE METABOLIC PANEL
ALT: 11 U/L — ABNORMAL LOW (ref 14–54)
ANION GAP: 8 (ref 5–15)
AST: 19 U/L (ref 15–41)
Albumin: 2.9 g/dL — ABNORMAL LOW (ref 3.5–5.0)
Alkaline Phosphatase: 108 U/L (ref 38–126)
BILIRUBIN TOTAL: 0.2 mg/dL — AB (ref 0.3–1.2)
BUN: 9 mg/dL (ref 6–20)
CHLORIDE: 104 mmol/L (ref 101–111)
CO2: 23 mmol/L (ref 22–32)
CREATININE: 0.71 mg/dL (ref 0.44–1.00)
Calcium: 8.6 mg/dL — ABNORMAL LOW (ref 8.9–10.3)
Glucose, Bld: 73 mg/dL (ref 65–99)
POTASSIUM: 3.5 mmol/L (ref 3.5–5.1)
Sodium: 135 mmol/L (ref 135–145)
Total Protein: 6.3 g/dL — ABNORMAL LOW (ref 6.5–8.1)

## 2017-03-19 MED ORDER — HYDROXYZINE PAMOATE 25 MG PO CAPS: 25.0000 mg | ORAL_CAPSULE | Freq: Three times a day (TID) | ORAL | 1 refills | Status: DC | PRN

## 2017-03-19 MED ORDER — HYDROXYZINE HCL 25 MG PO TABS
25.0000 mg | ORAL_TABLET | Freq: Once | ORAL | Status: AC
  Administered 2017-03-19: 25 mg via ORAL
  Filled 2017-03-19: qty 1

## 2017-03-19 NOTE — Discharge Instructions (Signed)
Cholestasis of Pregnancy Cholestasis refers to any condition that causes the flow of digestive fluid (bile) produced by the liver to slow down or stop. Cholestasis of pregnancy is most common toward the end of pregnancy (thirdtrimester), but it can occur any time during pregnancy. The condition often goes away soon after giving birth. Cholestasis may be uncomfortable, but it is usually harmless to you. However, it can be harmful to your baby. Cholestasis may increase the risk of:  Your baby being born too early (preterm delivery).  Your baby having a slow heart rate and lack of oxygen during delivery (fetal distress).  Losing your baby before delivery (stillbirth).  What are the causes? The exact cause of this condition is not known, but it may be related to:  Pregnancy hormones. The gallbladder normally holds bile until you need it to help digest fat in your diet. Pregnancy hormones may cause the flow of bile to slow down and back up into your liver. Bile may then get into your bloodstream and cause cholestasis symptoms.  Changes in your genes (genetic mutations). Specifically, genes that affect how the liver releases bile.  What increases the risk? You are more likely to develop this condition if:  You had cholestasis during a previous pregnancy.  You have a family history of cholestasis.  You have liver problems.  You are having multiple babies, such as twins or triplets.  What are the signs or symptoms? The most common symptom of this condition is intense itching (pruritus), especially on the palms of your hands and soles of your feet. The itching can spread to the rest of your body and is often worse at night. You will not usually have a rash. Other symptoms may include:  Feeling tired.  Pain in your upper right abdomen.  Dark-colored urine.  Light-colored stools.  Poor appetite.  Yellowish discoloration of your skin and the whites of your eyes (jaundice).  How is this  diagnosed? This condition is diagnosed based on:  Your medical history.  A physical exam.  Blood tests.  If you have an inherited risk for developing this condition, you may also have genetic testing. How is this treated? The goal of treatment is to make you comfortable and keep your baby safe. Your health care provider may:  Prescribe medicine to reduce bile acid in your bloodstream, relieve symptoms, and help keep your baby safe.  Give you vitamin K before delivery to prevent excessive bleeding.  Check your baby frequently (fetal monitoring).  Perform regular blood tests to check your bile levels and liver function until your baby is delivered.  Recommend starting (inducing) your labor and delivery by week 36 or 37 of pregnancy, or as soon as your baby's lungs have developed enough.  Follow these instructions at home:  Take over-the-counter and prescription medicines only as told by your health care provider.  Take cool baths to soothe itchy skin.  Wear comfortable, loose-fitting, cotton clothing to reduce itching.  Keep your fingernails short to prevent skin irritation from scratching.  Keep all follow-up visits and prenatal visits as told by your health care provider. This is important. Contact a health care provider if:  Your symptoms get worse, even with treatment.  You develop pain in your right side.  You have unusual swelling in your abdomen, feet, ankles, or legs.  You have a fever.  You are more thirsty than usual. Get help right away if:  You go into early labor.  You have a headache that   does not go away or causes changes in vision.  You have nausea or you vomit.  You have severe pain in your abdomen or shoulders.  You have shortness of breath. Summary  Cholestasis of pregnancy is most common toward the end of pregnancy (thirdtrimester), but it can occur any time during your pregnancy.  The condition often goes away soon after your baby is  born.  The most common symptom of cholestasis of pregnancy is intense itching (pruritus), especially on the palms of your hands and soles of your feet.  This condition may be treated with medicine, frequent monitoring, or starting (inducing) labor and delivery by week 36 or 37 of pregnancy. This information is not intended to replace advice given to you by your health care provider. Make sure you discuss any questions you have with your health care provider. Document Released: 11/06/2000 Document Revised: 10/24/2016 Document Reviewed: 10/24/2016 Elsevier Interactive Patient Education  2017 Elsevier Inc.  

## 2017-03-19 NOTE — MAU Note (Signed)
Pt reports skin rash on legs, butt and arms. States it is extremely itchy. Pt states she has tried ointments, creams, etc and nothing helping relieve the itch. States she called Dr and was told to come in. Pt denies contractions, vag bleeding or LOF. +FM

## 2017-03-19 NOTE — MAU Provider Note (Signed)
History     CSN: [id number]  Arrival date and time: 03/19/17 0451   First Provider Initiated Contact with Patient 03/19/17 [phone]      Chief Complaint  Patient presents with  . Rash   Diana Maxwell is a 21 y.o. G1P0 at [redacted]w[redacted]d who presents today with a rash. She states that she has been ithcy on her arms and back, and her left thigh has an area with some blisters. She denies any VB, LOF or contractions. She reports normal fetal movement. She denies any complications with this pregnancy at this time. She is seen at [facility]. Next appointment on 03/25/17.    Rash  This is a new problem. The current episode started in the past 7 days. The problem has been gradually worsening since onset. The affected locations include the left upper leg, left arm, right arm and back. The rash is characterized by blistering (only on thigh, other areas no blisters, but itching. ). She was exposed to nothing. Pertinent negatives include no fever or vomiting. Past treatments include antihistamine and anti-itch cream (aloe). The treatment provided no relief.    Past Medical History:  Diagnosis Date  . Gonorrhea 2016    Past Surgical History:  Procedure Laterality Date  . NO PAST SURGERIES      History reviewed. No pertinent family history.  Social History  Substance Use Topics  . Smoking status: Former Smoker    Packs/day: 0.25    Types: Cigarettes    Quit date: 05/04/2014  . Smokeless tobacco: Never Used  . Alcohol use No    Allergies:  Allergies  Allergen Reactions  . Pineapple Anaphylaxis    Prescriptions Prior to Admission  Medication Sig Dispense Refill Last Dose  . diphenhydrAMINE (BENADRYL) 50 MG capsule Take 50 mg by mouth every 6 (six) hours as needed.   Past Week at Unknown time  . Prenatal Vit-Fe Fumarate-FA (PRENATAL/FOLIC ACID PO) Take 1 tablet by mouth daily.   03/18/2017 at Unknown time  . Doxylamine-Pyridoxine (DICLEGIS) 10-10 MG TBEC Take 1 tablet by mouth 2 (two) times daily as  needed. (Patient not taking: Reported on 11/17/2016) 30 tablet 0 Not Taking at Unknown time  . Elastic Bandages & Supports (COMFORT FIT MATERNITY SUPP SM) MISC 1 Units by Does not apply route daily as needed. 1 each 0     Review of Systems  Constitutional: Negative for chills and fever.  Gastrointestinal: Negative for nausea and vomiting.  Genitourinary: Negative for dysuria, frequency, urgency, vaginal bleeding and vaginal discharge.  Skin: Positive for rash.   Physical Exam   Blood pressure 109/69, pulse 75, temperature 98.3 F (36.8 C), temperature source Oral, resp. rate 16, last menstrual period 08/10/2016, SpO2 98 %.  Physical Exam  Nursing note and vitals reviewed. Constitutional: She is oriented to person, place, and time. She appears well-developed and well-nourished. No distress.  HENT:  Head: Normocephalic.  Cardiovascular: Normal rate.   Respiratory: Effort normal.  GI: Soft. There is no tenderness. There is no rebound.  Neurological: She is alert and oriented to person, place, and time.  Skin: Skin is warm and dry. There is erythema.  Diffuse erythema on arms and legs Some raised bumps on legs   FHT: 135, moderate with 15x15 accels, no decels Toco: some UI. Patient denies feeling any cramping or pain.  Results for orders placed or performed during the hospital encounter of 03/19/17 (from the past 24 hour(s))  Comprehensive metabolic panel     Status: Abnormal  Collection Time: 03/19/17  5:51 AM  Result Value Ref Range   Sodium 135 135 - 145 mmol/L   Potassium 3.5 3.5 - 5.1 mmol/L   Chloride 104 101 - 111 mmol/L   CO2 23 22 - 32 mmol/L   Glucose, Bld 73 65 - 99 mg/dL   BUN 9 6 - 20 mg/dL   Creatinine, Ser 4.09 0.44 - 1.00 mg/dL   Calcium 8.6 (L) 8.9 - 10.3 mg/dL   Total Protein 6.3 (L) 6.5 - 8.1 g/dL   Albumin 2.9 (L) 3.5 - 5.0 g/dL   AST 19 15 - 41 U/L   ALT 11 (L) 14 - 54 U/L   Alkaline Phosphatase 108 38 - 126 U/L   Total Bilirubin 0.2 (L) 0.3 - 1.2  mg/dL   GFR calc non Af Amer >60 >60 mL/min   GFR calc Af Amer >60 >60 mL/min   Anion gap 8 5 - 15     MAU Course  Procedures  MDM LFTs and Bile acids pending Vistaril given   Assessment and Plan   1. Pruritus   2. [redacted] weeks gestation of pregnancy    DC home Comfort measures reviewed  3rd Trimester precautions  PTL precautions  Fetal kick counts RX: vistaril  TIDPRN #90 Return to MAU as needed FU with OB as planned  Follow-up Information    [facility] Follow up.   Contact information: [address] [place] [address] [phone]            Tawnya Crook 03/19/2017, 5:29 AM

## 2017-03-20 LAB — BILE ACIDS, TOTAL: Bile Acids Total: 14.3 umol/L (ref 4.7–24.5)

## 2017-03-24 ENCOUNTER — Telehealth: Payer: Self-pay | Admitting: Advanced Practice Midwife

## 2017-03-24 NOTE — Telephone Encounter (Signed)
Called patient to inform her that labs are all normal. She reports that the vistiril has helped with the itching, and that after the visit she remembered that she had used a new soap. She is going back to her old soap. All questions answered. Patient will FU as scheduled with the health department.  Thressa Sheller  8:11 PM 03/24/17

## 2017-04-15 LAB — OB RESULTS CONSOLE GC/CHLAMYDIA
CHLAMYDIA, DNA PROBE: NEGATIVE
Gonorrhea: NEGATIVE

## 2017-04-15 LAB — OB RESULTS CONSOLE GBS: STREP GROUP B AG: POSITIVE

## 2017-05-17 ENCOUNTER — Other Ambulatory Visit (HOSPITAL_COMMUNITY): Payer: Self-pay | Admitting: Nurse Practitioner

## 2017-05-17 ENCOUNTER — Encounter (HOSPITAL_COMMUNITY): Payer: Self-pay | Admitting: *Deleted

## 2017-05-17 ENCOUNTER — Telehealth (HOSPITAL_COMMUNITY): Payer: Self-pay | Admitting: *Deleted

## 2017-05-17 DIAGNOSIS — O480 Post-term pregnancy: Secondary | ICD-10-CM

## 2017-05-17 NOTE — Telephone Encounter (Signed)
Preadmission screen  

## 2017-05-18 ENCOUNTER — Ambulatory Visit (HOSPITAL_COMMUNITY)
Admission: RE | Admit: 2017-05-18 | Discharge: 2017-05-18 | Disposition: A | Discharge status: Home / Self Care | Payer: Medicaid Other | Source: Ambulatory Visit | Attending: Nurse Practitioner | Admitting: Nurse Practitioner

## 2017-05-18 DIAGNOSIS — O480 Post-term pregnancy: Secondary | ICD-10-CM | POA: Insufficient documentation

## 2017-05-18 DIAGNOSIS — Z3A40 40 weeks gestation of pregnancy: Secondary | ICD-10-CM | POA: Insufficient documentation

## 2017-05-21 ENCOUNTER — Other Ambulatory Visit (HOSPITAL_COMMUNITY): Payer: Self-pay | Admitting: Nurse Practitioner

## 2017-05-21 DIAGNOSIS — Z3A40 40 weeks gestation of pregnancy: Secondary | ICD-10-CM

## 2017-05-21 DIAGNOSIS — O480 Post-term pregnancy: Secondary | ICD-10-CM

## 2017-05-22 ENCOUNTER — Encounter (HOSPITAL_COMMUNITY): Payer: Self-pay | Admitting: *Deleted

## 2017-05-22 ENCOUNTER — Inpatient Hospital Stay (HOSPITAL_COMMUNITY)
Admission: AD | Admit: 2017-05-22 | Discharge: 2017-05-22 | Disposition: A | Discharge status: Home / Self Care | Payer: Medicaid Other | Source: Ambulatory Visit | Attending: Obstetrics & Gynecology | Admitting: Obstetrics & Gynecology

## 2017-05-22 DIAGNOSIS — R109 Unspecified abdominal pain: Secondary | ICD-10-CM | POA: Insufficient documentation

## 2017-05-22 DIAGNOSIS — M549 Dorsalgia, unspecified: Secondary | ICD-10-CM

## 2017-05-22 DIAGNOSIS — Z79899 Other long term (current) drug therapy: Secondary | ICD-10-CM | POA: Diagnosis not present

## 2017-05-22 DIAGNOSIS — O9989 Other specified diseases and conditions complicating pregnancy, childbirth and the puerperium: Secondary | ICD-10-CM

## 2017-05-22 DIAGNOSIS — Z87891 Personal history of nicotine dependence: Secondary | ICD-10-CM | POA: Insufficient documentation

## 2017-05-22 DIAGNOSIS — O26893 Other specified pregnancy related conditions, third trimester: Secondary | ICD-10-CM | POA: Diagnosis not present

## 2017-05-22 DIAGNOSIS — Z3A39 39 weeks gestation of pregnancy: Secondary | ICD-10-CM | POA: Insufficient documentation

## 2017-05-22 DIAGNOSIS — Z3689 Encounter for other specified antenatal screening: Secondary | ICD-10-CM

## 2017-05-22 LAB — URINALYSIS, ROUTINE W REFLEX MICROSCOPIC
BILIRUBIN URINE: NEGATIVE
Glucose, UA: NEGATIVE mg/dL
KETONES UR: NEGATIVE mg/dL
NITRITE: NEGATIVE
PH: 7 (ref 5.0–8.0)
Protein, ur: NEGATIVE mg/dL
SPECIFIC GRAVITY, URINE: 1.003 — AB (ref 1.005–1.030)

## 2017-05-22 MED ORDER — CYCLOBENZAPRINE HCL 10 MG PO TABS: 10.0000 mg | ORAL_TABLET | Freq: Three times a day (TID) | ORAL | 0 refills | Status: DC | PRN

## 2017-05-22 MED ORDER — CYCLOBENZAPRINE HCL 10 MG PO TABS
10.0000 mg | ORAL_TABLET | Freq: Three times a day (TID) | ORAL | Status: DC | PRN
  Administered 2017-05-22: 10 mg via ORAL
  Filled 2017-05-22: qty 1

## 2017-05-22 NOTE — Progress Notes (Signed)
Donette LarryMelanie Bhambri CNM notified of pt's u/a results, sve, uterine activity and L flank pain. Will see pt

## 2017-05-22 NOTE — MAU Provider Note (Signed)
History     CSN: [id number]  Arrival date and time: 05/22/17 [id number]    Chief Complaint  Patient presents with  . Flank Pain   G1 @39 .5 wks here with left back and flank pain. Sx started 2 days ago. No recent or known injury or strain. Pain is worse with deep inspiration, position changes, and FM. Warm shower provides temporary. No urinary sx. No fevers. No VB, LOF, or regular ctx. +   OB History    Gravida Para Term Preterm AB Living   1         0   SAB TAB Ectopic Multiple Live Births                  Past Medical History:  Diagnosis Date  . Anemia   . Gonorrhea 2016    Past Surgical History:  Procedure Laterality Date  . NO PAST SURGERIES      Family History  Problem Relation Age of Onset  . Hypertension Maternal Grandmother   . Diabetes Maternal Grandmother     Social History  Substance Use Topics  . Smoking status: Former Smoker    Packs/day: 0.25    Types: Cigarettes    Quit date: 05/04/2014  . Smokeless tobacco: Never Used  . Alcohol use No    Allergies:  Allergies  Allergen Reactions  . Pineapple Anaphylaxis    Prescriptions Prior to Admission  Medication Sig Dispense Refill Last Dose  . Prenatal Vit-Fe Fumarate-FA (PRENATAL/FOLIC ACID PO) Take 1 tablet by mouth daily.   Past Week at Unknown time  . diphenhydrAMINE (BENADRYL) 50 MG capsule Take 50 mg by mouth every 6 (six) hours as needed.   Past Week at Unknown time  . Doxylamine-Pyridoxine (DICLEGIS) 10-10 MG TBEC Take 1 tablet by mouth 2 (two) times daily as needed. (Patient not taking: Reported on 11/17/2016) 30 tablet 0 Not Taking at Unknown time  . Elastic Bandages & Supports (COMFORT FIT MATERNITY SUPP SM) MISC 1 Units by Does not apply route daily as needed. 1 each 0   . hydrOXYzine (VISTARIL) 25 MG capsule Take 1 capsule (25 mg total) by mouth 3 (three) times daily as needed. 90 capsule 1     Review of Systems  Gastrointestinal: Negative for abdominal pain.  Genitourinary: Positive for  flank pain. Negative for dysuria, hematuria, urgency and vaginal bleeding.  Musculoskeletal: Positive for back pain.   Physical Exam   Blood pressure 117/69, pulse 84, temperature 98.1 F (36.7 C), resp. rate 20, height 5\' 11"  (1.803 m), weight 93.9 kg (207 lb), last menstrual period 08/10/2016.  Physical Exam  Nursing note and vitals reviewed. Constitutional: She is oriented to person, place, and time. She appears well-developed and well-nourished.  HENT:  Head: Normocephalic and atraumatic.  Neck: Normal range of motion.  Cardiovascular: Normal rate.   Respiratory: Effort normal. No respiratory distress.  GI: Soft. She exhibits no distension. There is no tenderness. There is no CVA tenderness.  gravid  Genitourinary:  Genitourinary Comments: sve 0.5/70 per RN  Musculoskeletal: Normal range of motion.       Lumbar back: She exhibits tenderness (right, lower). She exhibits no deformity.  Neurological: She is alert and oriented to person, place, and time.  Skin: Skin is warm and dry.  Psychiatric: She has a normal mood and affect.   EFM: 125 bpm, mod variability, + accels, no decels Toco: irregular w/irritability  Results for orders placed or performed during the hospital encounter of 05/22/17 (from the  past 24 hour(s))  Urinalysis, Routine w reflex microscopic     Status: Abnormal   Collection Time: 05/22/17  5:40 AM  Result Value Ref Range   Color, Urine YELLOW YELLOW   APPearance CLEAR CLEAR   Specific Gravity, Urine 1.003 (L) 1.005 - 1.030   pH 7.0 5.0 - 8.0   Glucose, UA NEGATIVE NEGATIVE mg/dL   Hgb urine dipstick SMALL (A) NEGATIVE   Bilirubin Urine NEGATIVE NEGATIVE   Ketones, ur NEGATIVE NEGATIVE mg/dL   Protein, ur NEGATIVE NEGATIVE mg/dL   Nitrite NEGATIVE NEGATIVE   Leukocytes, UA SMALL (A) NEGATIVE   RBC / HPF 0-5 0 - 5 RBC/hpf   WBC, UA 0-5 0 - 5 WBC/hpf   Bacteria, UA RARE (A) NONE SEEN   Squamous Epithelial / LPF 0-5 (A) NONE SEEN   MAU Course   Procedures Flexeril Heating pad  MDM Labs ordered and reviewed. Pain likely MSK and worsened by fetal position. No evidence of UTI, nephrolithiasis, pyelo, or labor. Pain improved after Flexeril, pt resting quietly. Stable for discharge home.  Assessment and Plan   1. [redacted] weeks gestation of pregnancy   2. NST (non-stress test) reactive   3. Back pain affecting pregnancy in third trimester    Discharge home Follow up in OB office as scheduled Labor precautions Rx Flexeril Tylenol prn Heating pad prn  Allergies as of 05/22/2017      Reactions   Pineapple Anaphylaxis      Medication List    STOP taking these medications   Doxylamine-Pyridoxine 10-10 MG Tbec Commonly known as:  DICLEGIS     TAKE these medications   COMFORT FIT MATERNITY SUPP SM Misc 1 Units by Does not apply route daily as needed.   cyclobenzaprine 10 MG tablet Commonly known as:  FLEXERIL Take 1 tablet (10 mg total) by mouth 3 (three) times daily as needed for muscle spasms.   diphenhydrAMINE 50 MG capsule Commonly known as:  BENADRYL Take 50 mg by mouth every 6 (six) hours as needed.   hydrOXYzine 25 MG capsule Commonly known as:  VISTARIL Take 1 capsule (25 mg total) by mouth 3 (three) times daily as needed.   PRENATAL/FOLIC ACID PO Take 1 tablet by mouth daily.      Donette Larry, CNM 05/22/2017, 6:44 AM

## 2017-05-22 NOTE — Discharge Instructions (Signed)
Back Pain in Pregnancy °Back pain during pregnancy is common. Back pain may be caused by several factors that are related to changes during your pregnancy. °Follow these instructions at home: °Managing pain, stiffness, and swelling °· If directed, apply ice for sudden (acute) back pain. °? Put ice in a plastic bag. °? Place a towel between your skin and the bag. °? Leave the ice on for 20 minutes, 2-3 times per day. °· If directed, apply heat to the affected area before you exercise: °? Place a towel between your skin and the heat pack or heating pad. °? Leave the heat on for 20-30 minutes. °? Remove the heat if your skin turns bright red. This is especially important if you are unable to feel pain, heat, or cold. You may have a greater risk of getting burned. °Activity °· Exercise as told by your health care provider. Exercising is the best way to prevent or manage back pain. °· Listen to your body when lifting. If lifting hurts, ask for help or bend your knees. This uses your leg muscles instead of your back muscles. °· Squat down when picking up something from the floor. Do not bend over. °· Only use bed rest as told by your health care provider. Bed rest should only be used for the most severe episodes of back pain. °Standing, Sitting, and Lying Down °· Do not stand in one place for long periods of time. °· Use good posture when sitting. Make sure your head rests over your shoulders and is not hanging forward. Use a pillow on your lower back if necessary. °· Try sleeping on your side, preferably the left side, with a pillow or two between your legs. If you are sore after a night's rest, your bed may be too soft. A firm mattress may provide more support for your back during pregnancy. °General instructions °· Do not wear high heels. °· Eat a healthy diet. Try to gain weight within your health care provider's recommendations. °· Use a maternity girdle, elastic sling, or back brace as told by your health care  provider. °· Take over-the-counter and prescription medicines only as told by your health care provider. °· Keep all follow-up visits as told by your health care provider. This is important. This includes any visits with any specialists, such as a physical therapist. °Contact a health care provider if: °· Your back pain interferes with your daily activities. °· You have increasing pain in other parts of your body. °Get help right away if: °· You develop numbness, tingling, weakness, or problems with the use of your arms or legs. °· You develop severe back pain that is not controlled with medicine. °· You have a sudden change in bowel or bladder control. °· You develop shortness of breath, dizziness, or you faint. °· You develop nausea, vomiting, or sweating. °· You have back pain that is a rhythmic, cramping pain similar to labor pains. Labor pain is usually 1-2 minutes apart, lasts for about 1 minute, and involves a bearing down feeling or pressure in your pelvis. °· You have back pain and your water breaks or you have vaginal bleeding. °· You have back pain or numbness that travels down your leg. °· Your back pain developed after you fell. °· You develop pain on one side of your back. °· You see blood in your urine. °· You develop skin blisters in the area of your back pain. °This information is not intended to replace advice given to you   by your health care provider. Make sure you discuss any questions you have with your health care provider. °Document Released: 02/17/2006 Document Revised: 04/16/2016 Document Reviewed: 07/24/2015 °Elsevier Interactive Patient Education © 2018 [employer] ° °Braxton Hicks Contractions °Contractions of the uterus can occur throughout pregnancy, but they are not always a sign that you are in labor. You may have practice contractions called Braxton Hicks contractions. These false labor contractions are sometimes confused with true labor. °What are Braxton Hicks  contractions? °Braxton Hicks contractions are tightening movements that occur in the muscles of the uterus before labor. Unlike true labor contractions, these contractions do not result in opening (dilation) and thinning of the cervix. Toward the end of pregnancy (32-34 weeks), Braxton Hicks contractions can happen more often and may become stronger. These contractions are sometimes difficult to tell apart from true labor because they can be very uncomfortable. You should not feel embarrassed if you go to the hospital with false labor. °Sometimes, the only way to tell if you are in true labor is for your health care provider to look for changes in the cervix. The health care provider will do a physical exam and may monitor your contractions. If you are not in true labor, the exam should show that your cervix is not dilating and your water has not broken. °If there are no prenatal problems or other health problems associated with your pregnancy, it is completely safe for you to be sent home with false labor. You may continue to have Braxton Hicks contractions until you go into true labor. °How can I tell the difference between true labor and false labor? °· Differences °? False labor °? Contractions last 30-70 seconds.: Contractions are usually shorter and not as strong as true labor contractions. °? Contractions become very regular.: Contractions are usually irregular. °? Discomfort is usually felt in the top of the uterus, and it spreads to the lower abdomen and low back.: Contractions are often felt in the front of the lower abdomen and in the groin. °? Contractions do not go away with walking.: Contractions may go away when you walk around or change positions while lying down. °? Contractions usually become more intense and increase in frequency.: Contractions get weaker and are shorter-lasting as time goes on. °? The cervix dilates and gets thinner.: The cervix usually does not dilate or become thin. °Follow  these instructions at home: °· Take over-the-counter and prescription medicines only as told by your health care provider. °· Keep up with your usual exercises and follow other instructions from your health care provider. °· Eat and drink lightly if you think you are going into labor. °· If Braxton Hicks contractions are making you uncomfortable: °? Change your position from lying down or resting to walking, or change from walking to resting. °? Sit and rest in a tub of warm water. °? Drink enough fluid to keep your urine clear or pale yellow. Dehydration may cause these contractions. °? Do slow and deep breathing several times an hour. °· Keep all follow-up prenatal visits as told by your health care provider. This is important. °Contact a health care provider if: °· You have a fever. °· You have continuous pain in your abdomen. °Get help right away if: °· Your contractions become stronger, more regular, and closer together. °· You have fluid leaking or gushing from your vagina. °· You pass blood-tinged mucus (bloody show). °· You have bleeding from your vagina. °· You have low back pain that you   never had before. °· You feel your baby’s head pushing down and causing pelvic pressure. °· Your baby is not moving inside you as much as it used to. °Summary °· Contractions that occur before labor are called Braxton Hicks contractions, false labor, or practice contractions. °· Braxton Hicks contractions are usually shorter, weaker, farther apart, and less regular than true labor contractions. True labor contractions usually become progressively stronger and regular and they become more frequent. °· Manage discomfort from Braxton Hicks contractions by changing position, resting in a warm bath, drinking plenty of water, or practicing deep breathing. °This information is not intended to replace advice given to you by your health care provider. Make sure you discuss any questions you have with your health care  provider. °Document Released: 11/09/2005 Document Revised: 09/28/2016 Document Reviewed: 09/28/2016 °Elsevier Interactive Patient Education © 2017 [employer] ° °

## 2017-05-22 NOTE — MAU Note (Signed)
Pain started 2 days ago in LLQ and has gradually moved to L flank. Constantly hurts and movement makes it worse. CAnnot lay on L side. Denies vag bleeding or LOF. Nausea

## 2017-05-23 ENCOUNTER — Encounter (HOSPITAL_COMMUNITY): Payer: Self-pay

## 2017-05-23 ENCOUNTER — Inpatient Hospital Stay (HOSPITAL_COMMUNITY)
Admission: AD | Admit: 2017-05-23 | Discharge: 2017-05-23 | Disposition: A | Discharge status: Home / Self Care | Payer: Medicaid Other | Source: Ambulatory Visit | Attending: Obstetrics & Gynecology | Admitting: Obstetrics & Gynecology

## 2017-05-23 DIAGNOSIS — O471 False labor at or after 37 completed weeks of gestation: Secondary | ICD-10-CM

## 2017-05-23 DIAGNOSIS — Z3A39 39 weeks gestation of pregnancy: Secondary | ICD-10-CM | POA: Insufficient documentation

## 2017-05-23 NOTE — Progress Notes (Addendum)
G1@ 39.[redacted] wksga. Here due to ctx that's getting stronger and closer together. Denies LOF or bleeding. + FM   EFM applied  SVE:   1927: Provider notified. Report status of pt given. Orders received to d/c pt home with labor precations.   1935: Discharge instructions given with pt understanding. Pt left unit with her mother via ambulatory.

## 2017-05-23 NOTE — MAU Note (Signed)
Urine sent to lab 

## 2017-05-24 ENCOUNTER — Inpatient Hospital Stay (HOSPITAL_COMMUNITY): Admission: RE | Admit: 2017-05-24 | Payer: Medicaid Other | Source: Ambulatory Visit

## 2017-05-24 ENCOUNTER — Inpatient Hospital Stay (HOSPITAL_COMMUNITY)
Admission: AD | Admit: 2017-05-24 | Discharge: 2017-05-24 | Disposition: A | Discharge status: Home / Self Care | Payer: Medicaid Other | Source: Ambulatory Visit | Attending: Obstetrics & Gynecology | Admitting: Obstetrics & Gynecology

## 2017-05-24 ENCOUNTER — Ambulatory Visit (HOSPITAL_COMMUNITY)
Admission: RE | Admit: 2017-05-24 | Discharge: 2017-05-24 | Disposition: A | Discharge status: Home / Self Care | Payer: Medicaid Other | Source: Ambulatory Visit | Attending: Nurse Practitioner | Admitting: Nurse Practitioner

## 2017-05-24 DIAGNOSIS — Z3689 Encounter for other specified antenatal screening: Secondary | ICD-10-CM | POA: Diagnosis not present

## 2017-05-24 DIAGNOSIS — Z3493 Encounter for supervision of normal pregnancy, unspecified, third trimester: Secondary | ICD-10-CM | POA: Insufficient documentation

## 2017-05-24 DIAGNOSIS — O480 Post-term pregnancy: Secondary | ICD-10-CM | POA: Diagnosis not present

## 2017-05-24 DIAGNOSIS — Z87891 Personal history of nicotine dependence: Secondary | ICD-10-CM | POA: Insufficient documentation

## 2017-05-24 DIAGNOSIS — Z3A40 40 weeks gestation of pregnancy: Secondary | ICD-10-CM | POA: Insufficient documentation

## 2017-05-24 NOTE — Discharge Instructions (Signed)

## 2017-05-24 NOTE — MAU Note (Signed)
Patient sent from [facility]FM for evaluation BPP 6/8  Patient denies any complaints at this time

## 2017-05-24 NOTE — MAU Provider Note (Signed)
  History     CSN: [id number]  Arrival date and time: 05/24/17 1440   First Provider Initiated Contact with Patient 05/24/17 1449      Chief Complaint  Patient presents with  . Non-stress Test   Diana Maxwell is a 21 y.o. G1P0 at 3636w0d who presents here from [facility]. She was seen there today for a "post-dates" BPP. BPP was 6/8, and was sent here for NST. Patient reports some contractions off and on. She denies any VB or LOF. Reports normal fetal movement. Patient reports that she took a dose of flexeril this morning.    Past Medical History:  Diagnosis Date  . Anemia   . Gonorrhea 2016    Past Surgical History:  Procedure Laterality Date  . NO PAST SURGERIES      Family History  Problem Relation Age of Onset  . Hypertension Maternal Grandmother   . Diabetes Maternal Grandmother     Social History  Substance Use Topics  . Smoking status: Former Smoker    Packs/day: 0.25    Types: Cigarettes    Quit date: 05/04/2014  . Smokeless tobacco: Never Used  . Alcohol use No    Allergies:  Allergies  Allergen Reactions  . Pineapple Anaphylaxis    Prescriptions Prior to Admission  Medication Sig Dispense Refill Last Dose  . cyclobenzaprine (FLEXERIL) 10 MG tablet Take 1 tablet (10 mg total) by mouth 3 (three) times daily as needed for muscle spasms. 20 tablet 0   . diphenhydrAMINE (BENADRYL) 50 MG capsule Take 50 mg by mouth every 6 (six) hours as needed.   Past Week at Unknown time  . Elastic Bandages & Supports (COMFORT FIT MATERNITY SUPP SM) MISC 1 Units by Does not apply route daily as needed. 1 each 0   . hydrOXYzine (VISTARIL) 25 MG capsule Take 1 capsule (25 mg total) by mouth 3 (three) times daily as needed. 90 capsule 1   . Prenatal Vit-Fe Fumarate-FA (PRENATAL/FOLIC ACID PO) Take 1 tablet by mouth daily.   Past Week at Unknown time    Review of Systems Physical Exam   Blood pressure 133/78, pulse 88, temperature 98.2 F (36.8 C), temperature source Oral, resp. rate  16, last menstrual period 08/10/2016, SpO2 99 %.  Physical Exam  Nursing note and vitals reviewed. Constitutional: She is oriented to person, place, and time. She appears well-developed and well-nourished. No distress.  Cardiovascular: Normal rate.   Respiratory: Effort normal.  Neurological: She is alert and oriented to person, place, and time.  Psychiatric: She has a normal mood and affect.   FHT: 125, mdoerate with 15x15 accels, no decels Toco: lots of UI Cervical exam per RN: closed/thick  MAU Course  Procedures  MDM D/W Dr. Debroah LoopArnold, if patient has a reactive NST then she can be DC home.   Assessment and Plan   1. Non-stress test reactive   2. [redacted] weeks gestation of pregnancy    DC home Comfort measures reviewed  3rd Trimester precautions  labor precautions  Fetal kick counts RX: none  Return to MAU as needed FU with OB as planned  Follow-up Information    [facility].   Contact information: [address] [place] [id number] [phone]            Thressa ShellerHeather Orla Jolliff 05/24/2017, 2:57 PM

## 2017-05-26 ENCOUNTER — Other Ambulatory Visit: Payer: Self-pay | Admitting: Advanced Practice Midwife

## 2017-05-26 ENCOUNTER — Inpatient Hospital Stay (HOSPITAL_COMMUNITY)
Admission: AD | Admit: 2017-05-26 | Discharge: 2017-05-26 | Disposition: A | Discharge status: Home / Self Care | Payer: Medicaid Other | Source: Ambulatory Visit | Attending: Obstetrics & Gynecology | Admitting: Obstetrics & Gynecology

## 2017-05-26 DIAGNOSIS — O9989 Other specified diseases and conditions complicating pregnancy, childbirth and the puerperium: Secondary | ICD-10-CM

## 2017-05-26 DIAGNOSIS — M545 Low back pain: Secondary | ICD-10-CM | POA: Insufficient documentation

## 2017-05-26 DIAGNOSIS — Z87891 Personal history of nicotine dependence: Secondary | ICD-10-CM | POA: Diagnosis not present

## 2017-05-26 DIAGNOSIS — M549 Dorsalgia, unspecified: Secondary | ICD-10-CM | POA: Diagnosis not present

## 2017-05-26 DIAGNOSIS — O26893 Other specified pregnancy related conditions, third trimester: Secondary | ICD-10-CM | POA: Diagnosis not present

## 2017-05-26 DIAGNOSIS — O99891 Other specified diseases and conditions complicating pregnancy: Secondary | ICD-10-CM

## 2017-05-26 DIAGNOSIS — Z3A40 40 weeks gestation of pregnancy: Secondary | ICD-10-CM | POA: Diagnosis not present

## 2017-05-26 LAB — URINALYSIS, ROUTINE W REFLEX MICROSCOPIC
BILIRUBIN URINE: NEGATIVE
Glucose, UA: NEGATIVE mg/dL
Hgb urine dipstick: NEGATIVE
KETONES UR: NEGATIVE mg/dL
NITRITE: NEGATIVE
Protein, ur: NEGATIVE mg/dL
Specific Gravity, Urine: 1.005 (ref 1.005–1.030)
pH: 7 (ref 5.0–8.0)

## 2017-05-26 MED ORDER — OXYCODONE-ACETAMINOPHEN 5-325 MG PO TABS: 1.0000 | ORAL_TABLET | Freq: Four times a day (QID) | ORAL | 0 refills | Status: DC | PRN

## 2017-05-26 MED ORDER — OXYCODONE-ACETAMINOPHEN 5-325 MG PO TABS
1.0000 | ORAL_TABLET | Freq: Once | ORAL | Status: AC
  Administered 2017-05-26: 1 via ORAL
  Filled 2017-05-26: qty 1

## 2017-05-26 NOTE — MAU Provider Note (Signed)
History     CSN: [id number]  Arrival date and time: 05/26/17 2125   First Provider Initiated Contact with Patient 05/26/17 2159      Chief Complaint  Patient presents with  . Flank Pain   HPI [name] is a 21 y.o. G1P0 at 1747w2d who presents to MAU today with complaint of left sided back pain. The patient states that this has been present for > 1 week, but worse since she was seen on 05/24/17. She states that pain is mid thoracic and lumbar on the left side only. She states that the pain radiates to her mid abdomen. She states pain is worse with standing and ambulation and improved with rest. She was taking Flexeril, but stopped because it made her nauseous. She took Tylenol 500 mg at 1800 with minimal relief. She denies vaginal bleeding, LOF, UTI symptoms or fever. She has had some contractions, but states that they are mild. She rates her back pain at 9/10 now.   OB History    Gravida Para Term Preterm AB Living   1         0   SAB TAB Ectopic Multiple Live Births                  Past Medical History:  Diagnosis Date  . Anemia   . Gonorrhea 2016    Past Surgical History:  Procedure Laterality Date  . NO PAST SURGERIES      Family History  Problem Relation Age of Onset  . Hypertension Maternal Grandmother   . Diabetes Maternal Grandmother     Social History  Substance Use Topics  . Smoking status: Former Smoker    Packs/day: 0.25    Types: Cigarettes    Quit date: 05/04/2014  . Smokeless tobacco: Never Used  . Alcohol use No    Allergies:  Allergies  Allergen Reactions  . Pineapple Anaphylaxis    Prescriptions Prior to Admission  Medication Sig Dispense Refill Last Dose  . cyclobenzaprine (FLEXERIL) 10 MG tablet Take 1 tablet (10 mg total) by mouth 3 (three) times daily as needed for muscle spasms. 20 tablet 0   . diphenhydrAMINE (BENADRYL) 50 MG capsule Take 50 mg by mouth every 6 (six) hours as needed.   Past Week at Unknown time  . Elastic  Bandages & Supports (COMFORT FIT MATERNITY SUPP SM) MISC 1 Units by Does not apply route daily as needed. 1 each 0   . hydrOXYzine (VISTARIL) 25 MG capsule Take 1 capsule (25 mg total) by mouth 3 (three) times daily as needed. 90 capsule 1   . Prenatal Vit-Fe Fumarate-FA (PRENATAL/FOLIC ACID PO) Take 1 tablet by mouth daily.   Past Week at Unknown time    Review of Systems  Constitutional: Negative for fever.  Gastrointestinal: Positive for abdominal pain. Negative for constipation, diarrhea, nausea and vomiting.  Genitourinary: Negative for dysuria, frequency, urgency, vaginal bleeding and vaginal discharge.  Musculoskeletal: Positive for back pain.   Physical Exam   Blood pressure 129/76, pulse 94, temperature 97.6 F (36.4 C), resp. rate 16, last menstrual period 08/10/2016.  Physical Exam  Nursing note and vitals reviewed. Constitutional: She is oriented to person, place, and time. She appears well-developed and well-nourished. No distress.  HENT:  Head: Normocephalic and atraumatic.  Cardiovascular: Normal rate.   Respiratory: Effort normal.  GI: Soft. She exhibits no distension and no mass. There is no tenderness. There is no rebound and no guarding.  Musculoskeletal:  Arms: Neurological: She is alert and oriented to person, place, and time.  Skin: Skin is warm and dry. No erythema.  Psychiatric: She has a normal mood and affect.   Cervical Exam:  Dilation: Closed Effacement (%): Thick Exam by:: Millner, RN    Results for orders placed or performed during the hospital encounter of 05/26/17 (from the past 24 hour(s))  Urinalysis, Routine w reflex microscopic     Status: Abnormal   Collection Time: 05/26/17  9:25 PM  Result Value Ref Range   Color, Urine YELLOW YELLOW   APPearance CLEAR CLEAR   Specific Gravity, Urine 1.005 1.005 - 1.030   pH 7.0 5.0 - 8.0   Glucose, UA NEGATIVE NEGATIVE mg/dL   Hgb urine dipstick NEGATIVE NEGATIVE   Bilirubin Urine NEGATIVE  NEGATIVE   Ketones, ur NEGATIVE NEGATIVE mg/dL   Protein, ur NEGATIVE NEGATIVE mg/dL   Nitrite NEGATIVE NEGATIVE   Leukocytes, UA MODERATE (A) NEGATIVE   RBC / HPF 0-5 0 - 5 RBC/hpf   WBC, UA 0-5 0 - 5 WBC/hpf   Bacteria, UA RARE (A) NONE SEEN   Squamous Epithelial / LPF 0-5 (A) NONE SEEN   Fetal Monitoring: Baseline: 130 bpm Variability: moderate Accelerations: 15 x 15 Decelerations: none Contractions: q 2-4 minutes  MAU Course  Procedures None  MDM UA today  Urine culture ordered Percocet given for pain.  No cervical change at recheck.  No significant change in patient's pain rating, however appears very comfortable while resting.   Assessment and Plan  A: SIUP at [redacted]w[redacted]d Musculoskeletal back pain, left thoracic region   P: Discharge home Tylenol PRN for pain Advised use of abdominal binder consistently and heat/ice to the affected area during rest.  Labor precautions discussed Patient advised to follow-up with [facility] as scheduled for routine prenatal care this week.  Patient may return to MAU as needed or if her condition were to change or worsen   Vonzella Nipple, PA-C 05/26/2017, 9:59 PM

## 2017-05-26 NOTE — MAU Note (Signed)
Pt. Here for sharp pain in the left lower back that radiates to left side of abd. Pt. Has been seen for existing condition and given flexeril for pain. Pt. States it does not work, leaves her nauseous. Pt. Denies vaginal bleeding, sudden gush of fluid, and positive for fetal movement.  Pain 9/10.

## 2017-05-26 NOTE — Discharge Instructions (Signed)
Back Pain in Pregnancy Back pain during pregnancy is common. Back pain may be caused by several factors that are related to changes during your pregnancy. Follow these instructions at home: Managing pain, stiffness, and swelling  If directed, apply ice for sudden (acute) back pain. ? Put ice in a plastic bag. ? Place a towel between your skin and the bag. ? Leave the ice on for 20 minutes, 2-3 times per day.  If directed, apply heat to the affected area before you exercise: ? Place a towel between your skin and the heat pack or heating pad. ? Leave the heat on for 20-30 minutes. ? Remove the heat if your skin turns bright red. This is especially important if you are unable to feel pain, heat, or cold. You may have a greater risk of getting burned. Activity  Exercise as told by your health care provider. Exercising is the best way to prevent or manage back pain.  Listen to your body when lifting. If lifting hurts, ask for help or bend your knees. This uses your leg muscles instead of your back muscles.  Squat down when picking up something from the floor. Do not bend over.  Only use bed rest as told by your health care provider. Bed rest should only be used for the most severe episodes of back pain. Standing, Sitting, and Lying Down  Do not stand in one place for long periods of time.  Use good posture when sitting. Make sure your head rests over your shoulders and is not hanging forward. Use a pillow on your lower back if necessary.  Try sleeping on your side, preferably the left side, with a pillow or two between your legs. If you are sore after a night's rest, your bed may be too soft. A firm mattress may provide more support for your back during pregnancy. General instructions  Do not wear high heels.  Eat a healthy diet. Try to gain weight within your health care provider's recommendations.  Use a maternity girdle, elastic sling, or back brace as told by your health care  provider.  Take over-the-counter and prescription medicines only as told by your health care provider.  Keep all follow-up visits as told by your health care provider. This is important. This includes any visits with any specialists, such as a physical therapist. Contact a health care provider if:  Your back pain interferes with your daily activities.  You have increasing pain in other parts of your body. Get help right away if:  You develop numbness, tingling, weakness, or problems with the use of your arms or legs.  You develop severe back pain that is not controlled with medicine.  You have a sudden change in bowel or bladder control.  You develop shortness of breath, dizziness, or you faint.  You develop nausea, vomiting, or sweating.  You have back pain that is a rhythmic, cramping pain similar to labor pains. Labor pain is usually 1-2 minutes apart, lasts for about 1 minute, and involves a bearing down feeling or pressure in your pelvis.  You have back pain and your water breaks or you have vaginal bleeding.  You have back pain or numbness that travels down your leg.  Your back pain developed after you fell.  You develop pain on one side of your back.  You see blood in your urine.  You develop skin blisters in the area of your back pain. This information is not intended to replace advice given to you   by your health care provider. Make sure you discuss any questions you have with your health care provider. Document Released: 02/17/2006 Document Revised: 04/16/2016 Document Reviewed: 07/24/2015 Elsevier Interactive Patient Education  2018 Elsevier Inc.  

## 2017-05-27 ENCOUNTER — Encounter (HOSPITAL_COMMUNITY): Payer: Self-pay

## 2017-05-28 ENCOUNTER — Inpatient Hospital Stay (HOSPITAL_COMMUNITY)
Admission: AD | Admit: 2017-05-28 | Discharge: 2017-05-28 | Disposition: A | Discharge status: Home / Self Care | Payer: Medicaid Other | Source: Ambulatory Visit | Attending: Obstetrics and Gynecology | Admitting: Obstetrics and Gynecology

## 2017-05-28 DIAGNOSIS — O479 False labor, unspecified: Secondary | ICD-10-CM

## 2017-05-28 DIAGNOSIS — O471 False labor at or after 37 completed weeks of gestation: Secondary | ICD-10-CM | POA: Diagnosis present

## 2017-05-28 DIAGNOSIS — Z3A40 40 weeks gestation of pregnancy: Secondary | ICD-10-CM | POA: Diagnosis not present

## 2017-05-28 LAB — CULTURE, OB URINE

## 2017-05-28 NOTE — Discharge Instructions (Signed)

## 2017-05-31 ENCOUNTER — Inpatient Hospital Stay (HOSPITAL_COMMUNITY): Payer: Medicaid Other | Admitting: Anesthesiology

## 2017-05-31 ENCOUNTER — Inpatient Hospital Stay (HOSPITAL_COMMUNITY)
Admission: RE | Admit: 2017-05-31 | Discharge: 2017-06-03 | DRG: 774 | Disposition: A | Discharge status: Home / Self Care | Payer: Medicaid Other | Source: Ambulatory Visit | Attending: Family Medicine | Admitting: Family Medicine

## 2017-05-31 ENCOUNTER — Encounter (HOSPITAL_COMMUNITY): Payer: Self-pay

## 2017-05-31 DIAGNOSIS — Z87891 Personal history of nicotine dependence: Secondary | ICD-10-CM | POA: Diagnosis not present

## 2017-05-31 DIAGNOSIS — Z3A41 41 weeks gestation of pregnancy: Secondary | ICD-10-CM

## 2017-05-31 DIAGNOSIS — O99824 Streptococcus B carrier state complicating childbirth: Secondary | ICD-10-CM | POA: Diagnosis present

## 2017-05-31 DIAGNOSIS — O480 Post-term pregnancy: Secondary | ICD-10-CM | POA: Diagnosis present

## 2017-05-31 LAB — CBC
HCT: 29.3 % — ABNORMAL LOW (ref 36.0–46.0)
Hemoglobin: 10 g/dL — ABNORMAL LOW (ref 12.0–15.0)
MCH: 29.2 pg (ref 26.0–34.0)
MCHC: 34.1 g/dL (ref 30.0–36.0)
MCV: 85.4 fL (ref 78.0–100.0)
Platelets: 280 10*3/uL (ref 150–400)
RBC: 3.43 MIL/uL — ABNORMAL LOW (ref 3.87–5.11)
RDW: 13.5 % (ref 11.5–15.5)
WBC: 7.3 10*3/uL (ref 4.0–10.5)

## 2017-05-31 LAB — TYPE AND SCREEN
ABO/RH(D): B POS
Antibody Screen: NEGATIVE

## 2017-05-31 LAB — ABO/RH: ABO/RH(D): B POS

## 2017-05-31 LAB — RPR: RPR: NONREACTIVE

## 2017-05-31 MED ORDER — SOD CITRATE-CITRIC ACID 500-334 MG/5ML PO SOLN: 30.0000 mL | ORAL | Status: DC | PRN

## 2017-05-31 MED ORDER — DIPHENHYDRAMINE HCL 50 MG/ML IJ SOLN: 12.5000 mg | INTRAMUSCULAR | Status: DC | PRN

## 2017-05-31 MED ORDER — TERBUTALINE SULFATE 1 MG/ML IJ SOLN
0.2500 mg | Freq: Once | INTRAMUSCULAR | Status: DC | PRN
  Filled 2017-05-31: qty 1

## 2017-05-31 MED ORDER — LACTATED RINGERS IV SOLN
INTRAVENOUS | Status: DC
  Administered 2017-05-31 (×2): via INTRAVENOUS

## 2017-05-31 MED ORDER — EPHEDRINE 5 MG/ML INJ
10.0000 mg | INTRAVENOUS | Status: DC | PRN
  Filled 2017-05-31: qty 2

## 2017-05-31 MED ORDER — OXYTOCIN 40 UNITS IN LACTATED RINGERS INFUSION - SIMPLE MED
2.5000 [IU]/h | INTRAVENOUS | Status: DC
  Administered 2017-06-01: 2.5 [IU]/h via INTRAVENOUS
  Filled 2017-05-31: qty 1000

## 2017-05-31 MED ORDER — PENICILLIN G POT IN DEXTROSE 60000 UNIT/ML IV SOLN
3.0000 10*6.[IU] | INTRAVENOUS | Status: DC
  Administered 2017-05-31 – 2017-06-01 (×4): 3 10*6.[IU] via INTRAVENOUS
  Filled 2017-05-31 (×7): qty 50

## 2017-05-31 MED ORDER — PENICILLIN G POTASSIUM 5000000 UNITS IJ SOLR
5.0000 10*6.[IU] | Freq: Once | INTRAVENOUS | Status: AC
  Administered 2017-05-31: 5 10*6.[IU] via INTRAVENOUS
  Filled 2017-05-31: qty 5

## 2017-05-31 MED ORDER — OXYTOCIN BOLUS FROM INFUSION
500.0000 mL | Freq: Once | INTRAVENOUS | Status: AC
  Administered 2017-06-01: 500 mL via INTRAVENOUS

## 2017-05-31 MED ORDER — OXYCODONE-ACETAMINOPHEN 5-325 MG PO TABS: 1.0000 | ORAL_TABLET | ORAL | Status: DC | PRN

## 2017-05-31 MED ORDER — FENTANYL 2.5 MCG/ML BUPIVACAINE 1/10 % EPIDURAL INFUSION (WH - ANES)
14.0000 mL/h | INTRAMUSCULAR | Status: DC | PRN
  Administered 2017-05-31: 14 mL/h via EPIDURAL
  Filled 2017-05-31: qty 100

## 2017-05-31 MED ORDER — LACTATED RINGERS IV SOLN: 500.0000 mL | Freq: Once | INTRAVENOUS | Status: DC

## 2017-05-31 MED ORDER — LACTATED RINGERS IV SOLN: 500.0000 mL | INTRAVENOUS | Status: DC | PRN

## 2017-05-31 MED ORDER — LIDOCAINE HCL (PF) 1 % IJ SOLN
INTRAMUSCULAR | Status: DC | PRN
  Administered 2017-05-31: 4 mL
  Administered 2017-05-31: 6 mL via EPIDURAL

## 2017-05-31 MED ORDER — MISOPROSTOL 25 MCG QUARTER TABLET
25.0000 ug | ORAL_TABLET | ORAL | Status: DC | PRN
  Administered 2017-05-31 (×3): 25 ug via VAGINAL
  Filled 2017-05-31 (×4): qty 1

## 2017-05-31 MED ORDER — ONDANSETRON HCL 4 MG/2ML IJ SOLN: 4.0000 mg | Freq: Four times a day (QID) | INTRAMUSCULAR | Status: DC | PRN

## 2017-05-31 MED ORDER — LIDOCAINE HCL (PF) 1 % IJ SOLN
30.0000 mL | INTRAMUSCULAR | Status: AC | PRN
  Administered 2017-06-01: 30 mL via SUBCUTANEOUS
  Filled 2017-05-31: qty 30

## 2017-05-31 MED ORDER — PHENYLEPHRINE 40 MCG/ML (10ML) SYRINGE FOR IV PUSH (FOR BLOOD PRESSURE SUPPORT)
80.0000 ug | PREFILLED_SYRINGE | INTRAVENOUS | Status: DC | PRN
  Filled 2017-05-31: qty 5

## 2017-05-31 MED ORDER — FENTANYL CITRATE (PF) 100 MCG/2ML IJ SOLN
100.0000 ug | INTRAMUSCULAR | Status: DC | PRN
  Administered 2017-05-31 (×2): 100 ug via INTRAVENOUS
  Filled 2017-05-31 (×2): qty 2

## 2017-05-31 MED ORDER — PHENYLEPHRINE 40 MCG/ML (10ML) SYRINGE FOR IV PUSH (FOR BLOOD PRESSURE SUPPORT)
80.0000 ug | PREFILLED_SYRINGE | INTRAVENOUS | Status: DC | PRN
  Filled 2017-05-31: qty 5
  Filled 2017-05-31: qty 10

## 2017-05-31 MED ORDER — OXYCODONE-ACETAMINOPHEN 5-325 MG PO TABS: 2.0000 | ORAL_TABLET | ORAL | Status: DC | PRN

## 2017-05-31 MED ORDER — ACETAMINOPHEN 325 MG PO TABS: 650.0000 mg | ORAL_TABLET | ORAL | Status: DC | PRN

## 2017-05-31 NOTE — Progress Notes (Signed)
S: Patient seen & examined for progress of labor. Patient comfortable with epidural. States she is feeling pressure with contractions.   9.5 (ant lip)/100%/0 station.   FHT: 130 bpm, mod var, +accels, no decels TOCO: q371min   A/P: Labor: progressing normally Pain: epidural FWB: cat I   Continue expectant management Anticipate SVD  Howard PouchLauren Carolyn Maniscalco, MD PGY-2 [facility]

## 2017-05-31 NOTE — Progress Notes (Signed)
G1P0 at 6155w0d here for IOL for postdates, seen & examined for progress of labor. Patient comfortable with epidural. AROM to moderate meconium @2100 .  8.5/80%/-2.      FHT: 120 bpm, mod var, +accels, no decels TOCO: q481min   A/P: Labor: progressing normally Pain: epidural FWB: category I   Continue expectant management Anticipate SVD  Howard PouchLauren Zariah Cavendish, MD PGY-2 [facility]

## 2017-05-31 NOTE — Anesthesia Preprocedure Evaluation (Signed)
Anesthesia Evaluation  Patient identified by MRN, date of birth, ID band Patient awake    Reviewed: Allergy & Precautions, H&P , Patient's Chart, lab work & pertinent test results  Airway Mallampati: II  TM Distance: >3 FB Neck ROM: full    Dental  (+) Teeth Intact   Pulmonary former smoker,    breath sounds clear to auscultation       Cardiovascular  Rhythm:regular Rate:Normal     Neuro/Psych    GI/Hepatic   Endo/Other    Renal/GU      Musculoskeletal   Abdominal   Peds  Hematology   Anesthesia Other Findings       Reproductive/Obstetrics (+) Pregnancy                             Anesthesia Physical  Anesthesia Plan  ASA: II  Anesthesia Plan: Epidural   Post-op Pain Management:    Induction:   PONV Risk Score and Plan:   Airway Management Planned:   Additional Equipment:   Intra-op Plan:   Post-operative Plan:   Informed Consent: I have reviewed the patients History and Physical, chart, labs and discussed the procedure including the risks, benefits and alternatives for the proposed anesthesia with the patient or authorized representative who has indicated his/her understanding and acceptance.   Dental Advisory Given  Plan Discussed with:   Anesthesia Plan Comments: (Labs checked- platelets confirmed with RN in room. Fetal heart tracing, per RN, reported to be stable enough for sitting procedure. Discussed epidural, and patient consents to the procedure:  included risk of possible headache,backache, failed block, allergic reaction, and nerve injury. This patient was asked if she had any questions or concerns before the procedure started.)        Anesthesia Quick Evaluation  

## 2017-05-31 NOTE — H&P (Signed)
LABOR AND DELIVERY ADMISSION HISTORY AND PHYSICAL NOTE  Diana Maxwell is a 21 y.o. female G1P0 with IUP at 5721w0d by LMP conistent with US presenting for IOL for post dates.   She reports positive fetal movement. She denies leakage of fluid or vaginal bleeding.  Prenatal History/Complications:  Past Medical History: Past Medical History:  Diagnosis Date  . Anemia   . Gonorrhea 2016    Past Surgical History: Past Surgical History:  Procedure Laterality Date  . NO PAST SURGERIES      Obstetrical History: OB History    Gravida Para Term Preterm AB Living   1         0   SAB TAB Ectopic Multiple Live Births                  Social History: Social History   Social History  . Marital status: Single    Spouse name: N/A  . Number of children: N/A  . Years of education: N/A   Social History Main Topics  . Smoking status: Former Smoker    Packs/day: 0.25    Types: Cigarettes    Quit date: 05/04/2014  . Smokeless tobacco: Never Used  . Alcohol use No  . Drug use: No  . Sexual activity: Yes    Birth control/ protection: None   Other Topics Concern  . None   Social History Narrative  . None    Family History: Family History  Problem Relation Age of Onset  . Hypertension Maternal Grandmother   . Diabetes Maternal Grandmother     Allergies: Allergies  Allergen Reactions  . Pineapple Anaphylaxis    Prescriptions Prior to Admission  Medication Sig Dispense Refill Last Dose  . acetaminophen (TYLENOL) 500 MG tablet Take 1,000 mg by mouth every 6 (six) hours as needed for mild pain or headache.   05/30/2017 at Unknown time  . Prenatal Vit-Fe Fumarate-FA (PRENATAL MULTIVITAMIN) TABS tablet Take 1 tablet by mouth daily at 12 noon.   Past Week at Unknown time  . cyclobenzaprine (FLEXERIL) 10 MG tablet Take 1 tablet (10 mg total) by mouth 3 (three) times daily as needed for muscle spasms. (Patient not taking: Reported on 05/31/2017) 20 tablet 0 Not Taking at Unknown time   . Elastic Bandages & Supports (COMFORT FIT MATERNITY SUPP SM) MISC 1 Units by Does not apply route daily as needed. 1 each 0   . hydrOXYzine (VISTARIL) 25 MG capsule Take 1 capsule (25 mg total) by mouth 3 (three) times daily as needed. (Patient not taking: Reported on 05/31/2017) 90 capsule 1 Not Taking at Unknown time  . oxyCODONE-acetaminophen (PERCOCET/ROXICET) 5-325 MG tablet Take 1 tablet by mouth every 6 (six) hours as needed for severe pain. (Patient not taking: Reported on 05/31/2017) 6 tablet 0 Not Taking at Unknown time     Review of Systems   All systems reviewed and negative except as stated in HPI  Blood pressure 129/87, pulse 89, temperature 98.2 F (36.8 C), temperature source Oral, resp. rate 16, height 5\' 11"  (1.803 m), weight 213 lb (96.6 kg), last menstrual period 08/10/2016. General appearance: alert, cooperative and appears stated age Lungs: no respiratory distress Heart: regular rate Abdomen: soft, non-tender; bowel sounds normal Extremities: No calf swelling or tenderness Presentation: cephalic by nursing exam Fetal monitoring: category 1 Uterine activity: no contractions Dilation: Closed Effacement (%): Thick Station: -2 Exam by:: lee   Prenatal labs: ABO, Rh: --/--/B POS (11/26 0940) Antibody: Negative (11/26 0000) Rubella: immun  RPR: Nonreactive (11/26 0000)  HBsAg: Negative (11/26 0000)  HIV: Non-reactive (11/26 0000)  GBS: Positive (05/24 0000)  1 hr Glucola: normal Genetic screening:  normal Anatomy US: normal  Prenatal Transfer Tool  Maternal Diabetes: No Genetic Screening: Normal Maternal Ultrasounds/Referrals: Normal Fetal Ultrasounds or other Referrals:  None Maternal Substance Abuse:  No Significant Maternal Medications:  None Significant Maternal Lab Results: Lab values include: Group B Strep positive  Results for orders placed or performed during the hospital encounter of 05/31/17 (from the past 24 hour(s))  CBC   Collection Time:  05/31/17  7:49 AM  Result Value Ref Range   WBC 7.3 4.0 - 10.5 K/uL   RBC 3.43 (L) 3.87 - 5.11 MIL/uL   Hemoglobin 10.0 (L) 12.0 - 15.0 g/dL   HCT 29.5 (L) 62.1 - 30.8 %   MCV 85.4 78.0 - 100.0 fL   MCH 29.2 26.0 - 34.0 pg   MCHC 34.1 30.0 - 36.0 g/dL   RDW 65.7 84.6 - 96.2 %   Platelets 280 150 - 400 K/uL    There are no active problems to display for this patient.   Assessment: Diana Maxwell is a 21 y.o. G1P0 at [redacted]w[redacted]d here for IOL for post dates  #Labor:cytotec follow by Foley balloon #Pain: Plans for epidrual #FWB: Category 1 #ID:  GBS pos - PCN #MOF: breast #MOC:OCPs #Circ:  n/A  Ernestina Penna 05/31/2017, 9:34 AM   \

## 2017-05-31 NOTE — Progress Notes (Signed)
Patient seen and doing well.  Experiencing significantly more pain, asking for epidural.  Cervix 6/80/-2.   Category 1 tracing.  Plan for SVD.  Ernestina PennaNicholas Chalmers Iddings, MD 05/31/17 7:46 PM

## 2017-05-31 NOTE — Anesthesia Procedure Notes (Signed)
Epidural Patient location during procedure: OB  Staffing Anesthesiologist: Amberlea Spagnuolo  Preanesthetic Checklist Completed: patient identified, site marked, surgical consent, pre-op evaluation, timeout performed, IV checked, risks and benefits discussed and monitors and equipment checked  Epidural Patient position: sitting Prep: site prepped and draped and DuraPrep Patient monitoring: continuous pulse ox and blood pressure Approach: midline Location: L3-L4 Injection technique: LOR air  Needle:  Needle type: Tuohy  Needle gauge: 17 G Needle length: 9 cm and 9 Needle insertion depth: 6 cm Catheter type: closed end flexible Catheter size: 19 Gauge Catheter at skin depth: 12 cm Test dose: negative  Assessment Events: blood not aspirated, injection not painful, no injection resistance, negative IV test and no paresthesia  Additional Notes Dosing of Epidural:  1st dose, through catheter .............................................  Xylocaine 40 mg  2nd dose, through catheter, after waiting 3 minutes.........Xylocaine 60 mg    As each dose occurred, patient was free of IV sx; and patient exhibited no evidence of SA injection.  Patient is more comfortable after epidural dosed. Please see RN's note for documentation of vital signs,and FHR which are stable.  Patient reminded not to try to ambulate with numb legs, and that an RN must be present when she attempts to get up.        

## 2017-05-31 NOTE — Anesthesia Pain Management Evaluation Note (Signed)
  CRNA Pain Management Visit Note  Patient: Diana Maxwell, 21 y.o., female  "Hello [facility]. We have an anesthesia team available at all times to provide care throughout the hospital, including epidural management and anesthesia for C-section. I don't know your plan for the delivery whether it a natural birth, water birth, IV sedation, nitrous supplementation, doula or epidural, but we want to meet your pain goals."   1.Was your pain managed to your expectations on prior hospitalizations?   No prior hospitalizations  2.What is your expectation for pain management during this hospitalization?     Epidural, IV pain meds and Nitrous Oxide  3.How can we help you reach that goal? First baby and pt was open to discussion about all methods of pain management.  Record the patient's initial score and the patient's pain goal.   Pain: 0  Pain Goal: 5 [facility] wants you to be able to say your pain was always managed very well.  Arliss Hepburn 05/31/2017

## 2017-06-01 ENCOUNTER — Encounter (HOSPITAL_COMMUNITY): Payer: Self-pay

## 2017-06-01 DIAGNOSIS — Z3A41 41 weeks gestation of pregnancy: Secondary | ICD-10-CM

## 2017-06-01 DIAGNOSIS — O99824 Streptococcus B carrier state complicating childbirth: Secondary | ICD-10-CM

## 2017-06-01 DIAGNOSIS — O480 Post-term pregnancy: Secondary | ICD-10-CM

## 2017-06-01 MED ORDER — BENZOCAINE-MENTHOL 20-0.5 % EX AERO
1.0000 "application " | INHALATION_SPRAY | CUTANEOUS | Status: DC | PRN
  Filled 2017-06-01: qty 56

## 2017-06-01 MED ORDER — WITCH HAZEL-GLYCERIN EX PADS: 1.0000 "application " | MEDICATED_PAD | CUTANEOUS | Status: DC | PRN

## 2017-06-01 MED ORDER — PRENATAL MULTIVITAMIN CH
1.0000 | ORAL_TABLET | Freq: Every day | ORAL | Status: DC
  Administered 2017-06-01 – 2017-06-03 (×3): 1 via ORAL
  Filled 2017-06-01 (×3): qty 1

## 2017-06-01 MED ORDER — TETANUS-DIPHTH-ACELL PERTUSSIS 5-2.5-18.5 LF-MCG/0.5 IM SUSP: 0.5000 mL | Freq: Once | INTRAMUSCULAR | Status: DC

## 2017-06-01 MED ORDER — IBUPROFEN 600 MG PO TABS
600.0000 mg | ORAL_TABLET | Freq: Four times a day (QID) | ORAL | Status: DC
  Administered 2017-06-01 – 2017-06-03 (×9): 600 mg via ORAL
  Filled 2017-06-01 (×10): qty 1

## 2017-06-01 MED ORDER — COCONUT OIL OIL
1.0000 "application " | TOPICAL_OIL | Status: DC | PRN
  Administered 2017-06-01: 1 via TOPICAL
  Filled 2017-06-01: qty 120

## 2017-06-01 MED ORDER — ONDANSETRON HCL 4 MG/2ML IJ SOLN: 4.0000 mg | INTRAMUSCULAR | Status: DC | PRN

## 2017-06-01 MED ORDER — SENNOSIDES-DOCUSATE SODIUM 8.6-50 MG PO TABS
2.0000 | ORAL_TABLET | ORAL | Status: DC
  Administered 2017-06-01 – 2017-06-02 (×2): 2 via ORAL
  Filled 2017-06-01 (×2): qty 2

## 2017-06-01 MED ORDER — METHYLERGONOVINE MALEATE 0.2 MG/ML IJ SOLN
0.2000 mg | Freq: Once | INTRAMUSCULAR | Status: AC
  Administered 2017-06-01: 0.2 mg via INTRAMUSCULAR

## 2017-06-01 MED ORDER — SIMETHICONE 80 MG PO CHEW: 80.0000 mg | CHEWABLE_TABLET | ORAL | Status: DC | PRN

## 2017-06-01 MED ORDER — DIBUCAINE 1 % RE OINT: 1.0000 "application " | TOPICAL_OINTMENT | RECTAL | Status: DC | PRN

## 2017-06-01 MED ORDER — ZOLPIDEM TARTRATE 5 MG PO TABS
5.0000 mg | ORAL_TABLET | Freq: Every evening | ORAL | Status: DC | PRN
Start: 2017-06-01 — End: 2017-06-03

## 2017-06-01 MED ORDER — METHYLERGONOVINE MALEATE 0.2 MG/ML IJ SOLN
INTRAMUSCULAR | Status: AC
  Filled 2017-06-01: qty 1

## 2017-06-01 MED ORDER — ACETAMINOPHEN 325 MG PO TABS: 650.0000 mg | ORAL_TABLET | ORAL | Status: DC | PRN

## 2017-06-01 MED ORDER — DIPHENHYDRAMINE HCL 25 MG PO CAPS: 25.0000 mg | ORAL_CAPSULE | Freq: Four times a day (QID) | ORAL | Status: DC | PRN

## 2017-06-01 MED ORDER — MEDROXYPROGESTERONE ACETATE 150 MG/ML IM SUSP: 150.0000 mg | INTRAMUSCULAR | Status: DC | PRN

## 2017-06-01 MED ORDER — ONDANSETRON HCL 4 MG PO TABS: 4.0000 mg | ORAL_TABLET | ORAL | Status: DC | PRN

## 2017-06-01 NOTE — Anesthesia Postprocedure Evaluation (Signed)
Anesthesia Post Note  Patient: Diana Maxwell  Procedure(s) Performed: * No procedures listed *     Patient location during evaluation: Mother Baby Anesthesia Type: Epidural Level of consciousness: awake and alert and oriented Pain management: satisfactory to patient Vital Signs Assessment: post-procedure vital signs reviewed and stable Respiratory status: spontaneous breathing and nonlabored ventilation Cardiovascular status: stable Postop Assessment: no headache, no backache, no signs of nausea or vomiting, adequate PO intake and patient able to bend at knees (patient up walking) Anesthetic complications: no    Last Vitals:  Vitals:   06/01/17 0620 06/01/17 0720  BP: 134/86 128/81  Pulse: 77 71  Resp: 18 16  Temp: 37.1 C 36.9 C    Last Pain:  Vitals:   06/01/17 1147  TempSrc:   PainSc: 0-No pain   Pain Goal: Patients Stated Pain Goal: 3 (06/01/17 0557)               Madison HickmanGREGORY,Ineze Serrao

## 2017-06-01 NOTE — Lactation Note (Signed)
This note was copied from a baby's chart. Lactation Consultation Note  Patient Name: Diana Maxwell RUEAV'WToday's Date: 06/01/2017 Reason for consult: Initial assessment   Initial assessmDarrel Hooverent with mom of 10 hour old infant. Infant asleep in crib having just returned from hearing screen. Infant with 2 BF for 10-30 minutes and 1 stool since birth. LATCH scores 8-9. [facility] Mom reports she is pleased infant latches on well. Mom reports + breast growth with pregnancy. Mom reports she is able to hand express colostrum.  Enc mom to feed infant STS 8-12 x in 24 hours at first feeding cues. Enc mom to feed infant offering both breasts with each feeding and using head and pillow support throughout feeding. Enc mom to hand express prior to latch to stimulate milk flow and to hand express post BF to apply EBM to nipples.   Mom reports no questions/concetns at this time. BF Resources handout and LC Brochure given, mom informed of IP/OP Services, BF Support Groups and LC phone #. [facility] and is aware to call and make appt post d/c. Mom does not have a pump at home and [facility]. Enc mom to call out to desk for feeding assistance as needed.    Maternal Data Formula Feeding for Exclusion: No Has patient been taught Hand Expression?: Yes Does the patient have breastfeeding experience prior to this delivery?: No  Feeding Feeding Type: Breast Fed Length of feed: 30 min  LATCH Score/Interventions                      Lactation Tools Discussed/Used WIC Program: Yes   Consult Status Consult Status: Follow-up Date: 06/02/17 Follow-up type: In-patient    Diana Maxwell 06/01/2017, 2:29 PM

## 2017-06-02 ENCOUNTER — Encounter (HOSPITAL_COMMUNITY): Payer: Self-pay

## 2017-06-02 MED ORDER — IBUPROFEN 600 MG PO TABS: 600.0000 mg | ORAL_TABLET | Freq: Four times a day (QID) | ORAL | 0 refills | Status: DC | PRN

## 2017-06-02 NOTE — Discharge Instructions (Signed)

## 2017-06-02 NOTE — Discharge Summary (Addendum)
OB Discharge Summary     Patient Name: Diana Maxwell DOB: 1996/04/15 MRN: [id number]  Date of admission: 05/31/2017 Delivering MD: Howard Pouch   Date of discharge: 06/02/2017  Admitting diagnosis: INDUCTION Intrauterine pregnancy: [redacted]w[redacted]d     Secondary diagnosis:  Principal Problem:   Post term pregnancy at [redacted] weeks gestation Active Problems:   Normal delivery  Additional problems: GBS pos- multiple doses ppx PCN     Discharge diagnosis: Term Pregnancy Delivered                                                                                                Post partum procedures:none  Augmentation: AROM and Cytotec  Complications: None  Hospital course:  Induction of Labor With Vaginal Delivery   21 y.o. yo G1P1001 at [redacted]w[redacted]d was admitted to the hospital 05/31/2017 for induction of labor.  Indication for induction: Postdates.  Patient had an uncomplicated labor course as follows: Membrane Rupture Time/Date: 8:42 PM ,05/31/2017   Intrapartum Procedures: Episiotomy: None [1]                                         Lacerations:  2nd degree [3];Perineal [11]  Patient had delivery of a Viable infant.  Information for the patient's newborn:  Aracelis, Ulrey [[id number]]  Delivery Method: Vaginal, Spontaneous Delivery (Filed from Delivery Summary)   06/01/2017  Details of delivery can be found in separate delivery note.  Patient had a routine postpartum course. Patient desires early discharge and will be discharged home 06/02/17.  Physical exam  Vitals:   06/01/17 0720 06/01/17 1120 06/01/17 1928 06/02/17 0540  BP: 128/81 125/78 99/70 123/80  Pulse: 71 76 70 (!) 58  Resp: 16 18 18 18   Temp: 98.5 F (36.9 C) 98.4 F (36.9 C)  98.2 F (36.8 C)  TempSrc: Oral Oral  Oral  SpO2:      Weight:      Height:       General: alert and cooperative Lochia: appropriate Uterine Fundus: firm Incision: N/A DVT Evaluation: No evidence of DVT seen on physical exam. Labs: Lab Results   Component Value Date   WBC 7.3 05/31/2017   HGB 10.0 (L) 05/31/2017   HCT 29.3 (L) 05/31/2017   MCV 85.4 05/31/2017   PLT 280 05/31/2017   CMP Latest Ref Rng & Units 03/19/2017  Glucose 65 - 99 mg/dL 73  BUN 6 - 20 mg/dL 9  Creatinine 7.25 - 3.66 mg/dL 4.40  Sodium 347 - 425 mmol/L 135  Potassium 3.5 - 5.1 mmol/L 3.5  Chloride 101 - 111 mmol/L 104  CO2 22 - 32 mmol/L 23  Calcium 8.9 - 10.3 mg/dL 9.5(G)  Total Protein 6.5 - 8.1 g/dL 6.3(L)  Total Bilirubin 0.3 - 1.2 mg/dL 3.8(V)  Alkaline Phos 38 - 126 U/L 108  AST 15 - 41 U/L 19  ALT 14 - 54 U/L 11(L)    Discharge instruction: per After Visit Summary and "Baby and Me Booklet".  After visit meds:  Continue to take Ibuprofen 600 mg q 6 hours    Diet: routine diet  Activity: Advance as tolerated. Pelvic rest for 6 weeks.   Outpatient follow up:6 weeks Follow up Appt:No future appointments. Follow up Visit:No Follow-up on file.  Postpartum contraception: Progesterone only pills  Newborn Data: Live born female  Birth Weight: 9 lb 0.4 oz (4094 g) APGAR: 8, 9  Baby Feeding: Breast Disposition:home with mother   06/02/2017 Cam HaiSHAW, KIMBERLY, CNM  7:15 AM   Attestation of Attending Supervision of Advanced Practitioner (CNM/NP): Evaluation and management procedures were performed by the Advanced Practitioner under my supervision and collaboration. I have reviewed the Advanced Practitioner's note and chart, and I agree with the management and plan.  Elsie LincolnKelly Tarin Navarez, MD 8:17 AM

## 2017-06-02 NOTE — Progress Notes (Signed)
Ok per MD to d/c discharge order. Baby has to stay another day and mom would like to continue receiving nursing care

## 2017-06-03 MED ORDER — IBUPROFEN 600 MG PO TABS: 600.0000 mg | ORAL_TABLET | Freq: Four times a day (QID) | ORAL | 1 refills | Status: DC | PRN

## 2017-06-03 NOTE — Lactation Note (Signed)
This note was copied from a baby's chart. Lactation Consultation Note  Patient Name: Girl Darrel HooverCarrie Boedecker OZHYQ'MToday's Date: 06/03/2017   Visited with Mom on day of discharge, baby 5354 hrs old.  LC during the night started supplementing with 25 ml formula due to rising bilirubin and no stool in over 24 hrs.  Baby had 2 stools in first 24 hrs of life, but hadn't on 2nd day.  Baby had stool this am.   Mom feels comfortable with supplementing using the SNS at the breast.  Talked about using it on her finger as alternative way to supplement. Mom has DEBP set up at bedside, but hasn't pumped.  Mom using hand expression, breast massage, and using hand pump.  [facility], but Mom declined, and she feels baby latches well, and is now more active on the breast. Offered an OP lactation appointment to follow up.  Appointment made for 7/17@2PM . Encouraged STS, and feeding often when baby cues.  [facility], to check bilirubin, output, and weight. Engorgement prevention and treatment discussed.   Encouraged Mom to call prn.   Judee ClaraSmith, Stacey Maura E 06/03/2017, 10:24 AM

## 2017-06-03 NOTE — Lactation Note (Addendum)
This note was copied from a baby's chart. Lactation Consultation Note Baby hadn't had a stool in 24 hrs. Had 2 smears in diaper during the night w/urine. Baby has had 7 voids and 5 stools in 49 hours of life.  Mom has everted nipples. Hand expressed Rt. Breast easily drop of colostrum, Lt. Breast massaged and hand expressed several minutes w/barely glisten to tip of nipple.  Discussed supplementing d/t no stool in 24 hrs., elevated bili, and drawing a serum for level check. Discussed choices of supplementing. Mom prefers to do BF supplement, SNS initiated, taught set up and cleaning. Alimentum started. Gave feeding sheet, reviewed at hours of age baby needs 25 ml of supplement. Set up DEBP, Mom shown how to use DEBP & how to disassemble, clean, & reassemble parts. Encouraged to post-pump 5-6 times a day for 15 min. Give any colostrum can pump or hand express. Reviewed plan again before leaving, mom did verbal teach back. Baby BF well w/SNS at mom's breast in football hold. Encouraged comfort and support during BF. Reported to RN. Mom has WIC, will call today for appt.  D/C home will depend on bili serum.  LC will need to f/u before d/c to assess SNS feedings. Discussed SNS was just until moms milk came in or breast started filling, when mom colostrum to supplement w/mom is to stop SNS unless MD says otherwise.   Patient Name: Girl Darrel HooverCarrie Karpf ZOXWR'UToday's Date: 06/03/2017 Reason for consult: Follow-up assessment;Infant weight loss;Hyperbilirubinemia   Maternal Data    Feeding Feeding Type: Formula Length of feed: 15 min (still BF)  LATCH Score/Interventions Latch: Grasps breast easily, tongue down, lips flanged, rhythmical sucking. Intervention(s): Adjust position;Assist with latch;Breast massage;Breast compression  Audible Swallowing: None Intervention(s): Skin to skin;Hand expression Intervention(s): Alternate breast massage  Type of Nipple: Everted at rest and after  stimulation  Comfort (Breast/Nipple): Filling, red/small blisters or bruises, mild/mod discomfort  Problem noted: Mild/Moderate discomfort Interventions (Mild/moderate discomfort): Hand massage;Hand expression;Post-pump  Hold (Positioning): Assistance needed to correctly position infant at breast and maintain latch. Intervention(s): Breastfeeding basics reviewed;Support Pillows;Position options;Skin to skin  LATCH Score: 6  Lactation Tools Discussed/Used Tools: Pump Breast pump type: Double-Electric Breast Pump Pump Review: Setup, frequency, and cleaning;Milk Storage Initiated by:: Peri JeffersonL. Zeki Bedrosian RN IBCLC Date initiated:: 06/03/17   Consult Status Consult Status: Follow-up Date: 06/03/17 Follow-up type: In-patient    Charyl DancerCARVER, Kadeidra Coryell G 06/03/2017, 5:51 AM

## 2017-06-08 ENCOUNTER — Ambulatory Visit: Payer: Self-pay

## 2017-06-08 NOTE — Lactation Note (Addendum)
This note was copied from a baby's chart. Lactation Consult  Mother's reason for visit:  Protect milk supply Visit Type:  Feeding assessment, counseling Appointment Notes:  See below Consult:  Initial Lactation Consultant:  Donn Pierini  ________________________________________________________________________  Sharene Skeans Name:  Diana Maxwell Date of Birth:  06/01/2019 Pediatrician:  [facility] Gender:  female Gestational Age: [redacted]w[redacted]d(At Birth) Birth Weight:  9 lb 0.4 oz (4094 g) Weight at Discharge:                          Date of Discharge:   There were no vitals filed for this visit. [facility]  8 lb 15 oz     Location:Pediatrician's office Weight today:  8 lb 15.3 oz (4062 grams) with clean diaper     ________________________________________________________________________  Mother's Name: Diana RAmie CritchleyType of delivery:  Vaginal, Spontaneous Delivery Breastfeeding Experience:  Has stopped BF over the last few days due to thrush in infant mouth. Mom is pumping 2 x a day with manual pump Maternal Medical Conditions:   Maternal Medications:  Ibuprofen, PNV  ________________________________________________________________________  Breastfeeding History (Post Discharge)  Frequency of breastfeeding:  None in the last 2 day Duration of feeding:  n/a  Supplementation  Formula:  Volume 2 oz Frequency:  Every 2-3 hours Total volume per day:  15-18 oz       Brand: Similac  Breastmilk:  Volume 671mFrequency:  BID Total volume per day:  6044mMethod:  Bottle,   Pumping  Type of pump:  Manual Frequency:  BID Volume:   60 ml  Infant Intake and Output Assessment  Voids:  6-8 in 24 hrs.  Color:  Clear yellow Stools:  4 in 24 hrs.  Color:  Yellow  ________________________________________________________________________  Maternal Breast Assessment  Breast:  Soft Nipple:  Erect Pain level:   0 Pain interventions:  Bra  _______________________________________________________________________ Feeding Assessment/Evaluation  Initial feeding assessment:  Infant's oral assessment:  Variance- infant with thrush  Infant not willing to nurse  4062 g  (8 lb. 15.3 oz.)  Initial OP consult for mom of 7 d63y old infant. Mom would like to get infant to exclusively BF.  Infant was asleep and had recently fed formula. Mom attempted to get her to feed without success. Mom stopped BF a few days ago when infant was pulling on and off the breast and she noted worsening thrush in infant mouth. Mom denies s/s engorgement.   Infant noted to have thick coating of thrush in her mouth, she had no diaper rash. Infant is being treated with Nystatin. Mom denies pain, itching or burning to breasts. Nipples are normal in appearance. Mom is not being treated. Discussed importance of mom and baby being treated at the same time.  Patient Instructions for Care of ThrRitta Slotr Mother and Baby hand out given and reviewed with mom. Mom has been boiling bottles, nipples, pacifiers and pump parts. She said MD told her to put Nystatin on her nipples also.  She is aware of good hand washing.   Mom is pumping 2 x a day with manual pump. She reports her manual pump broke yesterday, a new one was given at this visit. [facility] Reviewed [facility] with mom. She is planning to try and get the  money and come back today or tomorrow to get one. She was told how to use her DEBP kit that she has at home to double pump to save time. Enc mom to increase pumping to at least 8 x a day. Reviewed supply and demand and importance of frequent breast emptying.   Mom asked if she could take something to increase milk supply, reviewed that no supplement or food will work if pumping not occurring regularly. Fenugreek information given with instruction for use and dosage.  Advised mom to speak to PCP before taking. She denies BP issues. Mom without further questions/concerns at this time.    Written plan given to mom:  Put infant back to breast as soon and as much as possible.  Put her to breast before supplementing Continue to offer supplement post BF since supply is down  Continue to treat infant for Thrush as prescribed Follow patient instructions for care of thrush for mother and baby Pump both breasts at least 8 x a day for 15-20 minutes Return for pump rental if desires Call with any questions/concerns

## 2017-10-02 ENCOUNTER — Emergency Department (HOSPITAL_COMMUNITY): Payer: Self-pay

## 2017-10-02 ENCOUNTER — Encounter (HOSPITAL_COMMUNITY): Payer: Self-pay | Admitting: Emergency Medicine

## 2017-10-02 ENCOUNTER — Emergency Department (HOSPITAL_COMMUNITY)
Admission: EM | Admit: 2017-10-02 | Discharge: 2017-10-02 | Disposition: A | Discharge status: Home / Self Care | Payer: Self-pay | Attending: Emergency Medicine | Admitting: Emergency Medicine

## 2017-10-02 ENCOUNTER — Other Ambulatory Visit: Payer: Self-pay

## 2017-10-02 DIAGNOSIS — Z79899 Other long term (current) drug therapy: Secondary | ICD-10-CM | POA: Insufficient documentation

## 2017-10-02 DIAGNOSIS — R1084 Generalized abdominal pain: Secondary | ICD-10-CM

## 2017-10-02 DIAGNOSIS — Z87891 Personal history of nicotine dependence: Secondary | ICD-10-CM | POA: Insufficient documentation

## 2017-10-02 DIAGNOSIS — K5909 Other constipation: Secondary | ICD-10-CM | POA: Insufficient documentation

## 2017-10-02 LAB — CBC
HEMATOCRIT: 35.5 % — AB (ref 36.0–46.0)
HEMOGLOBIN: 12.2 g/dL (ref 12.0–15.0)
MCH: 29.6 pg (ref 26.0–34.0)
MCHC: 34.4 g/dL (ref 30.0–36.0)
MCV: 86.2 fL (ref 78.0–100.0)
Platelets: 358 10*3/uL (ref 150–400)
RBC: 4.12 MIL/uL (ref 3.87–5.11)
RDW: 13.2 % (ref 11.5–15.5)
WBC: 5.4 10*3/uL (ref 4.0–10.5)

## 2017-10-02 LAB — COMPREHENSIVE METABOLIC PANEL
ALT: 15 U/L (ref 14–54)
ANION GAP: 5 (ref 5–15)
AST: 18 U/L (ref 15–41)
Albumin: 3.9 g/dL (ref 3.5–5.0)
Alkaline Phosphatase: 92 U/L (ref 38–126)
BILIRUBIN TOTAL: 0.5 mg/dL (ref 0.3–1.2)
BUN: 8 mg/dL (ref 6–20)
CO2: 25 mmol/L (ref 22–32)
Calcium: 8.8 mg/dL — ABNORMAL LOW (ref 8.9–10.3)
Chloride: 108 mmol/L (ref 101–111)
Creatinine, Ser: 1.02 mg/dL — ABNORMAL HIGH (ref 0.44–1.00)
GFR calc Af Amer: >60 mL/min (ref >60)
Glucose, Bld: 104 mg/dL — ABNORMAL HIGH (ref 65–99)
POTASSIUM: 3.5 mmol/L (ref 3.5–5.1)
Sodium: 138 mmol/L (ref 135–145)
TOTAL PROTEIN: 6.6 g/dL (ref 6.5–8.1)

## 2017-10-02 LAB — URINALYSIS, ROUTINE W REFLEX MICROSCOPIC
Bilirubin Urine: NEGATIVE
Glucose, UA: NEGATIVE mg/dL
Hgb urine dipstick: NEGATIVE
KETONES UR: NEGATIVE mg/dL
LEUKOCYTES UA: NEGATIVE
NITRITE: NEGATIVE
PH: 8 (ref 5.0–8.0)
PROTEIN: NEGATIVE mg/dL
Specific Gravity, Urine: 1.02 (ref 1.005–1.030)

## 2017-10-02 LAB — LIPASE, BLOOD: Lipase: 44 U/L (ref 11–51)

## 2017-10-02 LAB — I-STAT BETA HCG BLOOD, ED (MC, WL, AP ONLY)

## 2017-10-02 MED ORDER — GI COCKTAIL ~~LOC~~
30.0000 mL | Freq: Once | ORAL | Status: AC
  Administered 2017-10-02: 30 mL via ORAL
  Filled 2017-10-02: qty 30

## 2017-10-02 NOTE — ED Provider Notes (Signed)
[facility] EMERGENCY DEPARTMENT Provider Note   CSN: [id number] Arrival date & time: 10/02/17  1732     History   Chief Complaint Chief Complaint  Patient presents with  . Abdominal Pain    HPI Diana Maxwell is a 21 y.o. female.  Patient presents with abdominal pain that started earlier today. She localizes the worst pain to the periumbilical area but states it is diffuse abdominal pain. No vomiting, diarrhea or constipation. She is not having any increased belching or gas. No fever, dysuria, vaginal discharge or unusual bleeding. No modifying factors. She did not try anything for symptoms prior to coming to the ED. She reports normal vaginal delivery of full term infant in July of this year and some degree of symptoms since that time. She is not breastfeeding.   The history is provided by the patient. No language interpreter was used.  Abdominal Pain   Pertinent negatives include fever, diarrhea, vomiting, constipation, dysuria and myalgias.    Past Medical History:  Diagnosis Date  . Anemia   . Gonorrhea 2016    Patient Active Problem List   Diagnosis Date Noted  . Normal delivery 06/01/2017    Past Surgical History:  Procedure Laterality Date  . NO PAST SURGERIES      OB History    Gravida Para Term Preterm AB Living   1 1 1     1    SAB TAB Ectopic Multiple Live Births         0 1       Home Medications    Prior to Admission medications   Medication Sig Start Date End Date Taking? Authorizing Provider  ibuprofen (ADVIL,MOTRIN) 600 MG tablet Take 1 tablet (600 mg total) by mouth every 6 (six) hours as needed. Patient taking differently: Take 600 mg every 6 (six) hours as needed by mouth for mild pain.  06/03/17  Yes Lesly Dukes, MD  Levonorgestrel-Ethinyl Estrad (LILLOW PO) Take 1 tablet daily by mouth.   Yes [provider]    Family History Family History  Problem Relation Age of Onset  . Hypertension Maternal  Grandmother   . Diabetes Maternal Grandmother     Social History Social History   Tobacco Use  . Smoking status: Former Smoker    Packs/day: 0.25    Types: Cigarettes    Last attempt to quit: 05/04/2014    Years since quitting: 3.4  . Smokeless tobacco: Never Used  Substance Use Topics  . Alcohol use: No  . Drug use: No     Allergies   Pineapple   Review of Systems Review of Systems  Constitutional: Negative for chills and fever.  Respiratory: Negative.   Cardiovascular: Negative.   Gastrointestinal: Positive for abdominal pain. Negative for constipation, diarrhea and vomiting.  Genitourinary: Negative.  Negative for dysuria, vaginal bleeding and vaginal discharge.  Musculoskeletal: Negative.  Negative for back pain and myalgias.  Skin: Negative.   Neurological: Negative.      Physical Exam Updated Vital Signs BP 131/80 (BP Location: Right Arm)   Pulse 94   Temp 98.4 F (36.9 C) (Oral)   Resp 18   SpO2 99%   Physical Exam  Constitutional: She appears well-developed and well-nourished.  HENT:  Head: Normocephalic.  Neck: Normal range of motion. Neck supple.  Cardiovascular: Normal rate and regular rhythm.  No murmur heard. Pulmonary/Chest: Effort normal and breath sounds normal. She has no rhonchi. She has no rales.  Abdominal: Soft. Normal appearance  and bowel sounds are normal. She exhibits no distension. There is no hepatosplenomegaly. There is no tenderness. There is no rebound and no guarding.  Musculoskeletal: Normal range of motion.  Neurological: She is alert. No cranial nerve deficit.  Skin: Skin is warm and dry. No rash noted.  Psychiatric: She has a normal mood and affect.     ED Treatments / Results  Labs (all labs ordered are listed, but only abnormal results are displayed) Labs Reviewed  COMPREHENSIVE METABOLIC PANEL - Abnormal; Notable for the following components:      Result Value   Glucose, Bld 104 (*)    Creatinine, Ser 1.02 (*)      Calcium 8.8 (*)    All other components within normal limits  CBC - Abnormal; Notable for the following components:   HCT 35.5 (*)    All other components within normal limits  LIPASE, BLOOD  URINALYSIS, ROUTINE W REFLEX MICROSCOPIC  I-STAT BETA HCG BLOOD, ED ([facility], [facility], [facility] ONLY)   Results for orders placed or performed during the hospital encounter of 10/02/17  Lipase, blood  Result Value Ref Range   Lipase 44 11 - 51 U/L  Comprehensive metabolic panel  Result Value Ref Range   Sodium 138 135 - 145 mmol/L   Potassium 3.5 3.5 - 5.1 mmol/L   Chloride 108 101 - 111 mmol/L   CO2 25 22 - 32 mmol/L   Glucose, Bld 104 (H) 65 - 99 mg/dL   BUN 8 6 - 20 mg/dL   Creatinine, Ser 1.611.02 (H) 0.44 - 1.00 mg/dL   Calcium 8.8 (L) 8.9 - 10.3 mg/dL   Total Protein 6.6 6.5 - 8.1 g/dL   Albumin 3.9 3.5 - 5.0 g/dL   AST 18 15 - 41 U/L   ALT 15 14 - 54 U/L   Alkaline Phosphatase 92 38 - 126 U/L   Total Bilirubin 0.5 0.3 - 1.2 mg/dL   GFR calc non Af Amer >60 >60 mL/min   GFR calc Af Amer >60 >60 mL/min   Anion gap 5 5 - 15  CBC  Result Value Ref Range   WBC 5.4 4.0 - 10.5 K/uL   RBC 4.12 3.87 - 5.11 MIL/uL   Hemoglobin 12.2 12.0 - 15.0 g/dL   HCT 09.635.5 (L) 04.536.0 - 40.946.0 %   MCV 86.2 78.0 - 100.0 fL   MCH 29.6 26.0 - 34.0 pg   MCHC 34.4 30.0 - 36.0 g/dL   RDW 81.113.2 91.411.5 - 78.215.5 %   Platelets 358 150 - 400 K/uL  Urinalysis, Routine w reflex microscopic  Result Value Ref Range   Color, Urine YELLOW YELLOW   APPearance CLEAR CLEAR   Specific Gravity, Urine 1.020 1.005 - 1.030   pH 8.0 5.0 - 8.0   Glucose, UA NEGATIVE NEGATIVE mg/dL   Hgb urine dipstick NEGATIVE NEGATIVE   Bilirubin Urine NEGATIVE NEGATIVE   Ketones, ur NEGATIVE NEGATIVE mg/dL   Protein, ur NEGATIVE NEGATIVE mg/dL   Nitrite NEGATIVE NEGATIVE   Leukocytes, UA NEGATIVE NEGATIVE  I-Stat beta hCG blood, ED  Result Value Ref Range   I-stat hCG, quantitative <5.0 <5 mIU/mL   Comment 3            EKG  EKG Interpretation None        Radiology No results found.  Procedures Procedures (including critical care time)  Medications Ordered in ED Medications  gi cocktail (Maalox,Lidocaine,Donnatal) (not administered)     Initial Impression / Assessment and Plan /  ED Course  I have reviewed the triage vital signs and the nursing notes.  Pertinent labs & imaging results that were available during my care of the patient were reviewed by me and considered in my medical decision making (see chart for details).     Patient presents with abdominal pain x 1 day. No modifying factors and not other symptoms. Pain is on and off.   Labs are normal. VSS. Plain x-ray pending. Abdominal exam is benign. Do not suspect infection, GE. Suspect constipation as likely diagnosis  Abd. Series shows no acute abnormality. Stool throughout. Will discharge home with Miralax tid x 3 days. REcommend PCP follow up as needed.   Final Clinical Impressions(s) / ED Diagnoses   Final diagnoses:  None   1. Abdominal pain 2. Constipation   ED Discharge Orders    None       Elpidio AnisUpstill, Ashanta Amoroso, Cordelia Poche-C 10/02/17 2301    Wynetta FinesMessick, Peter C, MD 10/02/17 [phone]

## 2017-10-02 NOTE — Discharge Instructions (Signed)
Recommend Miralax, one dose 3 times daily until bowels clear. Use for a maximum of 3 consecutive days. Follow up with your doctor for recheck as needed and return here with any worsening symptoms - high fever, severe pain, bloody stools.

## 2017-10-02 NOTE — ED Notes (Signed)
Patient transported to X-ray 

## 2017-10-02 NOTE — ED Triage Notes (Addendum)
Pt states nausea starting yesterday. Bilateral front/mid quadrant abdominal pain, cramping. Pt denies diarrhea, denies bloody stools. Takes birth control. Denies urinary/vaginal symptoms. Hx of STD. Unsure of LMP

## 2018-12-01 LAB — HM PAP SMEAR

## 2020-10-07 ENCOUNTER — Emergency Department
Admission: EM | Admit: 2020-10-07 | Discharge: 2020-10-07 | Disposition: A | Discharge status: Home / Self Care | Payer: Self-pay | Attending: Emergency Medicine | Admitting: Emergency Medicine

## 2020-10-07 ENCOUNTER — Other Ambulatory Visit: Payer: Self-pay

## 2020-10-07 ENCOUNTER — Encounter: Payer: Self-pay | Admitting: Emergency Medicine

## 2020-10-07 DIAGNOSIS — H6690 Otitis media, unspecified, unspecified ear: Secondary | ICD-10-CM

## 2020-10-07 DIAGNOSIS — Z20822 Contact with and (suspected) exposure to covid-19: Secondary | ICD-10-CM | POA: Insufficient documentation

## 2020-10-07 DIAGNOSIS — J069 Acute upper respiratory infection, unspecified: Secondary | ICD-10-CM | POA: Insufficient documentation

## 2020-10-07 DIAGNOSIS — Z87891 Personal history of nicotine dependence: Secondary | ICD-10-CM | POA: Insufficient documentation

## 2020-10-07 DIAGNOSIS — H6691 Otitis media, unspecified, right ear: Secondary | ICD-10-CM | POA: Insufficient documentation

## 2020-10-07 LAB — RESPIRATORY PANEL BY RT PCR (FLU A&B, COVID)
Influenza A by PCR: NEGATIVE
Influenza B by PCR: NEGATIVE
SARS Coronavirus 2 by RT PCR: NEGATIVE

## 2020-10-07 MED ORDER — AMOXICILLIN-POT CLAVULANATE 875-125 MG PO TABS: 1.0000 | ORAL_TABLET | Freq: Two times a day (BID) | ORAL | 0 refills | Status: AC

## 2020-10-07 MED ORDER — AMOXICILLIN-POT CLAVULANATE 875-125 MG PO TABS: 1.0000 | ORAL_TABLET | Freq: Two times a day (BID) | ORAL | 0 refills | Status: DC

## 2020-10-07 NOTE — ED Notes (Signed)
Pt c/o congestion, pressure in right ear, and voice hoarse. Denies fever or runny nose

## 2020-10-07 NOTE — ED Provider Notes (Signed)
[facility] Emergency Department Provider Note  ____________________________________________  Time seen: Approximately 12:19 PM  I have reviewed the triage vital signs and the nursing notes.   HISTORY  Chief Complaint Nasal Congestion and Otalgia    HPI Asra Gambrel is a 24 y.o. female that presents to the emergency department for evaluation of right ear pain, nasal congestion, hoarse voice for 4 days.  She started with a slight nonproductive cough last night.  Her daughter has been sick with upper respiratory symptoms as well.  Patient has not received the Covid vaccine.  No fevers, shortness of breath.  Past Medical History:  Diagnosis Date  . Anemia   . Gonorrhea 2016    Patient Active Problem List   Diagnosis Date Noted  . Normal delivery 06/01/2017    Past Surgical History:  Procedure Laterality Date  . NO PAST SURGERIES      Prior to Admission medications   Medication Sig Start Date End Date Taking? Authorizing Provider  amoxicillin-clavulanate (AUGMENTIN) 875-125 MG tablet Take 1 tablet by mouth 2 (two) times daily for 10 days. 10/07/20 10/17/20  Enid Derry, PA-C  ibuprofen (ADVIL,MOTRIN) 600 MG tablet Take 1 tablet (600 mg total) by mouth every 6 (six) hours as needed. Patient taking differently: Take 600 mg every 6 (six) hours as needed by mouth for mild pain.  06/03/17   Lesly Dukes, MD  Levonorgestrel-Ethinyl Estrad (LILLOW PO) Take 1 tablet daily by mouth.    [provider]    Allergies Pineapple  Family History  Problem Relation Age of Onset  . Hypertension Maternal Grandmother   . Diabetes Maternal Grandmother     Social History Social History   Tobacco Use  . Smoking status: Former Smoker    Packs/day: 0.25    Types: Cigarettes    Quit date: 05/04/2014    Years since quitting: 6.4  . Smokeless tobacco: Never Used  Substance Use Topics  . Alcohol use: No  . Drug use: No     Review of Systems   Constitutional: No fever/chills Eyes: No visual changes. No discharge. ENT: Positive for congestion and rhinorrhea. Cardiovascular: No chest pain. Respiratory: Positive for cough. No SOB. Gastrointestinal: No abdominal pain.  No nausea, no vomiting.  No diarrhea.  No constipation. Musculoskeletal: Negative for musculoskeletal pain. Skin: Negative for rash, abrasions, lacerations, ecchymosis. Neurological: Negative for headaches.   ____________________________________________   PHYSICAL EXAM:  VITAL SIGNS: ED Triage Vitals  Enc Vitals Group     BP 10/07/20 1121 (!) 114/53     Pulse Rate 10/07/20 1121 (!) 57     Resp 10/07/20 1121 20     Temp 10/07/20 1121 98.3 F (36.8 C)     Temp Source 10/07/20 1121 Oral     SpO2 10/07/20 1121 100 %     Weight 10/07/20 1122 165 lb (74.8 kg)     Height 10/07/20 1122 5\' 11"  (1.803 m)     Head Circumference --      Peak Flow --      Pain Score 10/07/20 1122 7     Pain Loc --      Pain Edu? --      Excl. in GC? --      Constitutional: Alert and oriented. Well appearing and in no acute distress. Eyes: Conjunctivae are normal. PERRL. EOMI. No discharge. Head: Atraumatic. ENT: No frontal and maxillary sinus tenderness.      Ears: Right tympanic membrane is erythematous.  Left tympanic membrane  is pearly.      Nose: Mild congestion/rhinnorhea.      Mouth/Throat: Mucous membranes are moist. Oropharynx non-erythematous. Tonsils not enlarged. No exudates. Uvula midline. Neck: No stridor.   Hematological/Lymphatic/Immunilogical: No cervical lymphadenopathy. Cardiovascular: Normal rate, regular rhythm.  Good peripheral circulation. Respiratory: Normal respiratory effort without tachypnea or retractions. Lungs CTAB. Good air entry to the bases with no decreased or absent breath sounds. Gastrointestinal: Bowel sounds 4 quadrants. Soft and nontender to palpation. No guarding or rigidity. No palpable masses. No distention. Musculoskeletal: Full  range of motion to all extremities. No gross deformities appreciated. Neurologic:  Normal speech and language. No gross focal neurologic deficits are appreciated.  Skin:  Skin is warm, dry and intact. No rash noted. Psychiatric: Mood and affect are normal. Speech and behavior are normal. Patient exhibits appropriate insight and judgement.   ____________________________________________   LABS (all labs ordered are listed, but only abnormal results are displayed)  Labs Reviewed  RESPIRATORY PANEL BY RT PCR (FLU A&B, COVID)   ____________________________________________  EKG   ____________________________________________  RADIOLOGY  No results found.  ____________________________________________    PROCEDURES  Procedure(s) performed:    Procedures    Medications - No data to display   ____________________________________________   INITIAL IMPRESSION / ASSESSMENT AND PLAN / ED COURSE  Pertinent labs & imaging results that were available during my care of the patient were reviewed by me and considered in my medical decision making (see chart for details).  Review of the [place] CSRS was performed in accordance of the [facility] prior to dispensing any controlled drugs.     Patient presented to the emergency department for evaluation of URI symptoms. Vital signs and exam are reassuring.  Exam is consistent with an otitis media.  Symptoms are consistent with a viral URI and secondary bacterial otitis media.  Patient appears well and is staying well hydrated. Patient feels comfortable going home. Patient will be discharged home with prescriptions for Augmentin. Patient is to follow up with PCP as needed or otherwise directed. Patient is given ED precautions to return to the ED for any worsening or new symptoms.  Marshe Shrestha was evaluated in Emergency Department on 10/07/2020 for the symptoms described in the history of present illness. She was evaluated in the context of the  global COVID-19 pandemic, which necessitated consideration that the patient might be at risk for infection with the SARS-CoV-2 virus that causes COVID-19. Institutional protocols and algorithms that pertain to the evaluation of patients at risk for COVID-19 are in a state of rapid change based on information released by regulatory bodies including the CDC and federal and state organizations. These policies and algorithms were followed during the patient's care in the ED.   ____________________________________________  FINAL CLINICAL IMPRESSION(S) / ED DIAGNOSES  Final diagnoses:  Acute otitis media, unspecified otitis media type  Viral URI with cough      NEW MEDICATIONS STARTED DURING THIS VISIT:  ED Discharge Orders         Ordered    amoxicillin-clavulanate (AUGMENTIN) 875-125 MG tablet  2 times daily        10/07/20 1237              This chart was dictated using voice recognition software/Dragon. Despite best efforts to proofread, errors can occur which can change the meaning. Any change was purely unintentional.    Enid Derry, PA-C 10/07/20 1457    Dionne Bucy, MD 10/07/20 1525

## 2020-10-07 NOTE — ED Notes (Signed)
Patient verbalizes understanding of discharge instructions. Opportunity for questioning and answers were provided. Armband removed by staff, pt discharged from ED. Ambulated out to lobby  

## 2020-10-07 NOTE — ED Triage Notes (Signed)
Pt reports started with congestion a week ago and now has pain to her right ear and a hoarse voice. Pt reports also still congested, denies fevers.

## 2020-10-25 ENCOUNTER — Encounter: Payer: Self-pay | Admitting: Family Medicine

## 2020-10-25 ENCOUNTER — Other Ambulatory Visit: Payer: Self-pay

## 2020-10-25 ENCOUNTER — Ambulatory Visit: Payer: Medicaid Other | Admitting: Family Medicine

## 2020-10-25 VITALS — BP 108/58 | Ht 71.0 in | Wt 172.0 lb

## 2020-10-25 DIAGNOSIS — Z01419 Encounter for gynecological examination (general) (routine) without abnormal findings: Secondary | ICD-10-CM

## 2020-10-25 DIAGNOSIS — Z113 Encounter for screening for infections with a predominantly sexual mode of transmission: Secondary | ICD-10-CM

## 2020-10-25 DIAGNOSIS — J302 Other seasonal allergic rhinitis: Secondary | ICD-10-CM | POA: Insufficient documentation

## 2020-10-25 DIAGNOSIS — B379 Candidiasis, unspecified: Secondary | ICD-10-CM

## 2020-10-25 LAB — WET PREP FOR TRICH, YEAST, CLUE: Trichomonas Exam: NEGATIVE

## 2020-10-25 MED ORDER — CLOTRIMAZOLE 1 % VA CREA
1.0000 | TOPICAL_CREAM | Freq: Every day | VAGINAL | 0 refills | Status: AC
Start: 2020-10-25 — End: 2020-11-01
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
## 2020-10-25 NOTE — Progress Notes (Signed)
Wet mount reviewed and pt treated for yeast per standing order and per provider verbal order. ROI for [facility] Dept obtained and signed by pt. Phone call to [facility] to verify fax number for medical records. ROI faxed per Samara Snide, PA-C order and fax confirmation received. Pt reports she plans to use condoms if becomes sexually active and that she will RTC if decides to get on a BCM. Provider orders completed.

## 2020-10-25 NOTE — Addendum Note (Signed)
Addended by: Lyman Speller on: 10/25/2020 12:33 PM   Modules accepted: Orders
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
## 2020-11-04 ENCOUNTER — Encounter: Payer: Self-pay | Admitting: Physician Assistant

## 2021-04-22 ENCOUNTER — Other Ambulatory Visit: Payer: Self-pay

## 2021-04-22 ENCOUNTER — Emergency Department
Admission: EM | Admit: 2021-04-22 | Discharge: 2021-04-22 | Disposition: A | Discharge status: Home / Self Care | Payer: Medicaid Other | Attending: Emergency Medicine | Admitting: Emergency Medicine

## 2021-04-22 ENCOUNTER — Encounter: Payer: Self-pay | Admitting: Emergency Medicine

## 2021-04-22 DIAGNOSIS — J029 Acute pharyngitis, unspecified: Secondary | ICD-10-CM

## 2021-04-22 DIAGNOSIS — Z87891 Personal history of nicotine dependence: Secondary | ICD-10-CM | POA: Insufficient documentation

## 2021-04-22 DIAGNOSIS — Z20822 Contact with and (suspected) exposure to covid-19: Secondary | ICD-10-CM | POA: Insufficient documentation

## 2021-04-22 DIAGNOSIS — H9203 Otalgia, bilateral: Secondary | ICD-10-CM | POA: Insufficient documentation

## 2021-04-22 LAB — RESP PANEL BY RT-PCR (FLU A&B, COVID) ARPGX2
Influenza A by PCR: NEGATIVE
Influenza B by PCR: NEGATIVE
SARS Coronavirus 2 by RT PCR: NEGATIVE

## 2021-04-22 LAB — GROUP A STREP BY PCR: Group A Strep by PCR: NOT DETECTED

## 2021-04-22 LAB — MONONUCLEOSIS SCREEN: Mono Screen: NEGATIVE

## 2021-04-22 MED ORDER — KETOROLAC TROMETHAMINE 60 MG/2ML IM SOLN
60.0000 mg | Freq: Once | INTRAMUSCULAR | Status: AC
  Administered 2021-04-22: 60 mg via INTRAMUSCULAR
  Filled 2021-04-22: qty 2

## 2021-04-22 MED ORDER — IBUPROFEN 800 MG PO TABS: 800.0000 mg | ORAL_TABLET | Freq: Three times a day (TID) | ORAL | 0 refills | Status: DC | PRN

## 2021-04-22 MED ORDER — LIDOCAINE VISCOUS HCL 2 % MT SOLN
15.0000 mL | Freq: Once | OROMUCOSAL | Status: AC
  Administered 2021-04-22: 15 mL via OROMUCOSAL
  Filled 2021-04-22: qty 15

## 2021-04-22 MED ORDER — ACETAMINOPHEN 500 MG PO TABS
1000.0000 mg | ORAL_TABLET | Freq: Once | ORAL | Status: AC
  Administered 2021-04-22: 1000 mg via ORAL
  Filled 2021-04-22: qty 2

## 2021-04-22 NOTE — ED Triage Notes (Signed)
Patient ambulatory to triage with steady gait, without difficulty or distress noted; pt reports "sharp pain to ears, head and sore throat" for several days

## 2021-04-22 NOTE — ED Provider Notes (Signed)
[facility] Emergency Department Provider Note  ____________________________________________  Time seen: Approximately 5:34 AM  I have reviewed the triage vital signs and the nursing notes.   HISTORY  Chief Complaint Otalgia and Sore Throat   HPI Diana Maxwell is a 25 y.o. female presents for evaluation of sore throat and bilateral ear pain.  She has had 2 to 3 days of progressively worsening sore throat and ear pain.  Today has a low-grade fever.  No cough or congestion, no chest pain or shortness of breath, no pain with movement of her neck, no difficulty swallowing other than the pain.  No vomiting or diarrhea.   Past Medical History:  Diagnosis Date  . Anemia   . Gonorrhea 2016    Patient Active Problem List   Diagnosis Date Noted  . Seasonal allergies 10/25/2020    Past Surgical History:  Procedure Laterality Date  . NO PAST SURGERIES      Prior to Admission medications   Medication Sig Start Date End Date Taking? Authorizing Provider  ibuprofen (ADVIL) 800 MG tablet Take 1 tablet (800 mg total) by mouth every 8 (eight) hours as needed. 04/22/21  Yes Nita Sickle, MD    Allergies Pineapple  Family History  Problem Relation Age of Onset  . Hypertension Maternal Grandmother   . Diabetes Maternal Grandmother   . Liver cancer Maternal Grandmother   . Brain cancer Paternal Grandmother   . Breast cancer Maternal Aunt 61    Social History Social History   Tobacco Use  . Smoking status: Former Smoker    Packs/day: 0.25    Types: Cigarettes    Quit date: 05/04/2014    Years since quitting: 6.9  . Smokeless tobacco: Never Used  Vaping Use  . Vaping Use: Some days  . Devices: Hooka  Substance Use Topics  . Alcohol use: Yes    Comment: ocassionally  . Drug use: Never    Review of Systems  Constitutional: + fever. Eyes: Negative for visual changes. ENT: + sore throat and b/l ear pain Neck: No neck pain  Cardiovascular:  Negative for chest pain. Respiratory: Negative for shortness of breath. Gastrointestinal: Negative for abdominal pain, vomiting or diarrhea. Genitourinary: Negative for dysuria. Musculoskeletal: Negative for back pain. Skin: Negative for rash. Neurological: Negative for headaches, weakness or numbness. Psych: No SI or HI  ____________________________________________   PHYSICAL EXAM:  VITAL SIGNS: ED Triage Vitals  Enc Vitals Group     BP 04/22/21 0529 110/79     Pulse Rate 04/22/21 0529 96     Resp 04/22/21 0529 16     Temp 04/22/21 0529 (!) 100.6 F (38.1 C)     Temp Source 04/22/21 0529 Oral     SpO2 04/22/21 0529 97 %     Weight 04/22/21 0525 170 lb (77.1 kg)     Height 04/22/21 0525 5\' 11"  (1.803 m)     Head Circumference --      Peak Flow --      Pain Score 04/22/21 0525 10     Pain Loc --      Pain Edu? --      Excl. in GC? --     Constitutional: Alert and oriented. Well appearing and in no apparent distress. HEENT:      Head: Normocephalic and atraumatic.         Eyes: Conjunctivae are normal. Sclera is non-icteric.       Mouth/Throat: Mucous membranes are moist.  Bilateral tonsils  look slightly erythematous with no exudates, no peritonsillar abscess, floor the mouth is soft with no induration no trismus      Ear: Bilateral TMs visualized and clear      Neck: Supple with no signs of meningismus. Shotty bilateral cervical lymphadenopathy Cardiovascular: Regular rate and rhythm.  Respiratory: Normal respiratory effort.  Musculoskeletal:  No edema, cyanosis, or erythema of extremities. Neurologic: Normal speech and language. Face is symmetric. Moving all extremities. No gross focal neurologic deficits are appreciated. Skin: Skin is warm, dry and intact. No rash noted. Psychiatric: Mood and affect are normal. Speech and behavior are normal.  ____________________________________________   LABS (all labs ordered are listed, but only abnormal results are  displayed)  Labs Reviewed  GROUP A STREP BY PCR  RESP PANEL BY RT-PCR (FLU A&B, COVID) [id number]  MONONUCLEOSIS SCREEN   ____________________________________________  EKG  none  ____________________________________________  RADIOLOGY  none  ____________________________________________   PROCEDURES  Procedure(s) performed: None Procedures Critical Care performed:  None ____________________________________________   INITIAL IMPRESSION / ASSESSMENT AND PLAN / ED COURSE   25 y.o. female presents for evaluation of sore throat and bilateral ear pain x 2 days and now with fever.  She has erythematous tonsils bilaterally with no exudates, no peritonsillar abscess, no evidence of Ludewig's angina, no trismus, no induration of the neck.  She is handling her saliva with no difficulty.  TMs are visualized and clear.  She has a low-grade fever 100.36F but otherwise hemodynamically stable.  Will check for COVID, flu, strep, mono.  Will give viscous lidocaine, Tylenol and IM Toradol.       _____________________________________________ Please note:  Patient was evaluated in Emergency Department today for the symptoms described in the history of present illness. Patient was evaluated in the context of the global COVID-19 pandemic, which necessitated consideration that the patient might be at risk for infection with the SARS-CoV-2 virus that causes COVID-19. Institutional protocols and algorithms that pertain to the evaluation of patients at risk for COVID-19 are in a state of rapid change based on information released by regulatory bodies including the CDC and federal and state organizations. These policies and algorithms were followed during the patient's care in the ED.  Some ED evaluations and interventions may be delayed as a result of limited staffing during the pandemic.   [place] Controlled Substance Database was reviewed by me. ____________________________________________   FINAL CLINICAL  IMPRESSION(S) / ED DIAGNOSES   Final diagnoses:  Acute pharyngitis, unspecified etiology      NEW MEDICATIONS STARTED DURING THIS VISIT:  ED Discharge Orders         Ordered    ibuprofen (ADVIL) 800 MG tablet  Every 8 hours PRN        04/22/21 0646           Note:  This document was prepared using Dragon voice recognition software and may include unintentional dictation errors.    Nita Sickle, MD 04/22/21 [phone]

## 2021-06-13 ENCOUNTER — Emergency Department
Admission: EM | Admit: 2021-06-13 | Discharge: 2021-06-13 | Disposition: A | Discharge status: Home / Self Care | Payer: BLUE CROSS/BLUE SHIELD | Attending: Emergency Medicine | Admitting: Emergency Medicine

## 2021-06-13 ENCOUNTER — Encounter: Payer: Self-pay | Admitting: Emergency Medicine

## 2021-06-13 ENCOUNTER — Other Ambulatory Visit: Payer: Self-pay

## 2021-06-13 DIAGNOSIS — N898 Other specified noninflammatory disorders of vagina: Secondary | ICD-10-CM | POA: Insufficient documentation

## 2021-06-13 DIAGNOSIS — Z87891 Personal history of nicotine dependence: Secondary | ICD-10-CM | POA: Insufficient documentation

## 2021-06-13 DIAGNOSIS — R102 Pelvic and perineal pain: Secondary | ICD-10-CM

## 2021-06-13 LAB — COMPREHENSIVE METABOLIC PANEL
ALT: 16 U/L (ref 0–44)
AST: 18 U/L (ref 15–41)
Albumin: 4.3 g/dL (ref 3.5–5.0)
Alkaline Phosphatase: 96 U/L (ref 38–126)
Anion gap: 8 (ref 5–15)
BUN: 17 mg/dL (ref 6–20)
CO2: 23 mmol/L (ref 22–32)
Calcium: 9.1 mg/dL (ref 8.9–10.3)
Chloride: 105 mmol/L (ref 98–111)
Creatinine, Ser: 0.89 mg/dL (ref 0.44–1.00)
GFR, Estimated: >60 mL/min (ref >60)
Glucose, Bld: 76 mg/dL (ref 70–99)
Potassium: 3.6 mmol/L (ref 3.5–5.1)
Sodium: 136 mmol/L (ref 135–145)
Total Bilirubin: 0.8 mg/dL (ref 0.3–1.2)
Total Protein: 7.6 g/dL (ref 6.5–8.1)

## 2021-06-13 LAB — URINALYSIS, COMPLETE (UACMP) WITH MICROSCOPIC
Bilirubin Urine: NEGATIVE
Glucose, UA: NEGATIVE mg/dL
Hgb urine dipstick: NEGATIVE
Ketones, ur: NEGATIVE mg/dL
Leukocytes,Ua: NEGATIVE
Nitrite: NEGATIVE
Protein, ur: NEGATIVE mg/dL
Specific Gravity, Urine: 1.029 (ref 1.005–1.030)
pH: 5 (ref 5.0–8.0)

## 2021-06-13 LAB — CBC
HCT: 37.3 % (ref 36.0–46.0)
Hemoglobin: 12.9 g/dL (ref 12.0–15.0)
MCH: 30.6 pg (ref 26.0–34.0)
MCHC: 34.6 g/dL (ref 30.0–36.0)
MCV: 88.6 fL (ref 80.0–100.0)
Platelets: 259 10*3/uL (ref 150–400)
RBC: 4.21 MIL/uL (ref 3.87–5.11)
RDW: 13.3 % (ref 11.5–15.5)
WBC: 4.7 10*3/uL (ref 4.0–10.5)
nRBC: 0 % (ref 0.0–0.2)

## 2021-06-13 LAB — WET PREP, GENITAL
Clue Cells Wet Prep HPF POC: NONE SEEN
Sperm: NONE SEEN
Trich, Wet Prep: NONE SEEN
Yeast Wet Prep HPF POC: NONE SEEN

## 2021-06-13 LAB — CHLAMYDIA/NGC RT PCR (ARMC ONLY)
Chlamydia Tr: NOT DETECTED
N gonorrhoeae: NOT DETECTED

## 2021-06-13 LAB — LIPASE, BLOOD: Lipase: 38 U/L (ref 11–51)

## 2021-06-13 LAB — POC URINE PREG, ED: Preg Test, Ur: NEGATIVE

## 2021-06-13 NOTE — ED Triage Notes (Signed)
Low abd pain, vaginal odor.

## 2021-06-13 NOTE — ED Notes (Signed)
See triage note  Presents with some vaginal odor  Was recently treated for yeast infection  Then this am developed some vaginal pain

## 2021-06-13 NOTE — ED Provider Notes (Signed)
[facility] Emergency Department Provider Note ____________________________________________  Time seen: 0950  I have reviewed the triage vital signs and the nursing notes.  HISTORY  Chief Complaint  Abdominal Pain and Vaginal Discharge   HPI Diana Maxwell is a 25 y.o. female presents to the ER today with complaint of pelvic pressure, vaginal discharge and odor.  She reports this started 1 week ago.  She reports the pressure is intermittent.  She is not currently having any pelvic pain.  The vaginal discharge is clear.  She is unable to describe the odor but reports it is not a bad odor it is just an odor and typically she does not have an odor.  She denies urgency, frequency, dysuria or blood in her urine.  She denies vaginal itching, lesions or abnormal bleeding.  She reports she is due to start her menstrual cycle.  She is not currently sexually active but is concerned about STDs.  She has not taken anything OTC for her symptoms.  Past Medical History:  Diagnosis Date   Anemia    Gonorrhea 2016    Patient Active Problem List   Diagnosis Date Noted   Seasonal allergies 10/25/2020    Past Surgical History:  Procedure Laterality Date   NO PAST SURGERIES      Prior to Admission medications   Not on File    Allergies Pineapple  Family History  Problem Relation Age of Onset   Hypertension Maternal Grandmother    Diabetes Maternal Grandmother    Liver cancer Maternal Grandmother    Brain cancer Paternal Grandmother    Breast cancer Maternal Aunt 41    Social History Social History   Tobacco Use   Smoking status: Former    Packs/day: 0.25    Types: Cigarettes    Quit date: 05/04/2014    Years since quitting: 7.1   Smokeless tobacco: Never  Vaping Use   Vaping Use: Some days   Devices: Hooka  Substance Use Topics   Alcohol use: Yes    Comment: ocassionally   Drug use: Never    Review of Systems  Constitutional: Negative for fever,  chills or body aches. Cardiovascular: Negative for chest pain or chest tightness. Respiratory: Negative for cough or shortness of breath. Gastrointestinal: Positive for pelvic pressure.  Negative for abdominal pain, nausea, vomiting, constipation and diarrhea. Genitourinary: Positive for vaginal discharge and odor.  Negative for urgency, frequency, dysuria, or blood in her urine Musculoskeletal: Negative for low back pain. Skin: Negative for rash or lesion in the perineal area.  ____________________________________________  PHYSICAL EXAM:  VITAL SIGNS: ED Triage Vitals  Enc Vitals Group     BP 06/13/21 0923 110/87     Pulse Rate 06/13/21 0923 71     Resp 06/13/21 0923 18     Temp 06/13/21 0923 98.3 F (36.8 C)     Temp Source 06/13/21 0923 Oral     SpO2 06/13/21 0923 99 %     Weight 06/13/21 0944 169 lb 15.6 oz (77.1 kg)     Height 06/13/21 0944 5\' 11"  (1.803 m)     Head Circumference --      Peak Flow --      Pain Score 06/13/21 0918 1     Pain Loc --      Pain Edu? --      Excl. in GC? --     Constitutional: Alert and oriented. Well appearing and in no distress. Head: Normocephalic. Eyes:  Normal extraocular movements  Cardiovascular: Normal rate, regular rhythm.  Respiratory: Normal respiratory effort. No wheezes/rales/rhonchi. Gastrointestinal: Soft and nontender. No distention. Pelvic: Musculoskeletal: No difficulty with gait. Neurologic:  Normal speech and language. No gross focal neurologic deficits are appreciated. Skin:  Skin is warm, dry and intact. No rash noted. ___________________________________   LABS Labs Reviewed  WET PREP, GENITAL - Abnormal; Notable for the following components:      Result Value   WBC, Wet Prep HPF POC MANY (*)    All other components within normal limits  URINALYSIS, COMPLETE (UACMP) WITH MICROSCOPIC - Abnormal; Notable for the following components:   Color, Urine YELLOW (*)    APPearance CLOUDY (*)    Bacteria, UA RARE (*)     All other components within normal limits  CHLAMYDIA/NGC RT PCR ([facility] ONLY)            LIPASE, BLOOD  COMPREHENSIVE METABOLIC PANEL  CBC  POC URINE PREG, ED    ____________________________________________  INITIAL IMPRESSION / ASSESSMENT AND PLAN / ED COURSE  Pelvic Pressure, Vaginal Discharge, Odor:  DDx include acute cystitis, BV, gonorrhea, chlamydia, yeast vaginitis Wet prep negative Gonorrhea/chlamydia negative Symptoms could be related to the fact that she is due to start her period in a few days- discussed this with her No treatment needed at this time She would like to follow up with GYN as an outpatient ____________________________________________  FINAL CLINICAL IMPRESSION(S) / ED DIAGNOSES  Final diagnoses:  Vaginal discharge  Vaginal odor  Pelvic pressure in female      Lorre Munroe, NP 06/13/21 1211    Sharman Cheek, MD 06/13/21 [phone]

## 2021-06-13 NOTE — Discharge Instructions (Addendum)
You were seen today for pelvic pressure, vaginal odor and discharge.  Your STD panel was negative.  You do not have yeast or BV.  Your labs were unremarkable.  You are not pregnant.  Please call GYN to schedule an appointment for further evaluation if you would like.

## 2021-06-26 ENCOUNTER — Encounter: Payer: Self-pay | Admitting: Obstetrics and Gynecology

## 2021-06-30 ENCOUNTER — Encounter: Payer: Self-pay | Admitting: Obstetrics and Gynecology

## 2021-07-23 ENCOUNTER — Ambulatory Visit (INDEPENDENT_AMBULATORY_CARE_PROVIDER_SITE_OTHER): Payer: BLUE CROSS/BLUE SHIELD | Admitting: Obstetrics and Gynecology

## 2021-07-23 ENCOUNTER — Encounter: Payer: BLUE CROSS/BLUE SHIELD | Admitting: Obstetrics and Gynecology

## 2021-07-23 ENCOUNTER — Encounter: Payer: Self-pay | Admitting: Obstetrics and Gynecology

## 2021-07-23 ENCOUNTER — Other Ambulatory Visit: Payer: Self-pay

## 2021-07-23 VITALS — BP 120/75 | HR 53 | Resp 16 | Ht 71.0 in | Wt 178.5 lb

## 2021-07-23 DIAGNOSIS — Z01419 Encounter for gynecological examination (general) (routine) without abnormal findings: Secondary | ICD-10-CM | POA: Diagnosis not present

## 2021-07-23 DIAGNOSIS — Z3009 Encounter for other general counseling and advice on contraception: Secondary | ICD-10-CM | POA: Diagnosis not present

## 2021-07-23 MED ORDER — NORGESTIM-ETH ESTRAD TRIPHASIC 0.18/0.215/0.25 MG-25 MCG PO TABS: 1.0000 | ORAL_TABLET | Freq: Every day | ORAL | 3 refills | Status: DC

## 2021-07-23 NOTE — Progress Notes (Signed)
HPI:      [name] is a 25 y.o. G1P1001 who LMP was Patient's last menstrual period was 07/22/2021 (exact date).  Subjective:   She presents today for her annual examination.  She has no complaints.  She has normal regular cycles.  She desires birth control.  She has chosen OCPs.  She has taken OCPs before without problem.  If possible she is interested in an OCP that will help with her skin.  She is not currently sexually active.  She is up-to-date on her Pap smear.    Hx: The following portions of the patient's history were reviewed and updated as appropriate:             She  has a past medical history of Anemia and Gonorrhea (2016). She does not have any pertinent problems on file. She  has a past surgical history that includes No past surgeries. Her family history includes Brain cancer in her paternal grandmother; Breast cancer (age of onset: 20) in her maternal aunt; Diabetes in her maternal grandmother; Hypertension in her maternal grandmother; Liver cancer in her maternal grandmother. She  reports that she quit smoking about 7 years ago. Her smoking use included cigarettes. She smoked an average of .25 packs per day. She has never used smokeless tobacco. She reports current alcohol use. She reports that she does not use drugs. She has a current medication list which includes the following prescription(s): norgestimate-ethinyl estradiol triphasic. She is allergic to pineapple.       Review of Systems:  Review of Systems  Constitutional: Denied constitutional symptoms, night sweats, recent illness, fatigue, fever, insomnia and weight loss.  Eyes: Denied eye symptoms, eye pain, photophobia, vision change and visual disturbance.  Ears/Nose/Throat/Neck: Denied ear, nose, throat or neck symptoms, hearing loss, nasal discharge, sinus congestion and sore throat.  Cardiovascular: Denied cardiovascular symptoms, arrhythmia, chest pain/pressure, edema, exercise intolerance, orthopnea and  palpitations.  Respiratory: Denied pulmonary symptoms, asthma, pleuritic pain, productive sputum, cough, dyspnea and wheezing.  Gastrointestinal: Denied, gastro-esophageal reflux, melena, nausea and vomiting.  Genitourinary: Denied genitourinary symptoms including symptomatic vaginal discharge, pelvic relaxation issues, and urinary complaints.  Musculoskeletal: Denied musculoskeletal symptoms, stiffness, swelling, muscle weakness and myalgia.  Dermatologic: Denied dermatology symptoms, rash and scar.  Neurologic: Denied neurology symptoms, dizziness, headache, neck pain and syncope.  Psychiatric: Denied psychiatric symptoms, anxiety and depression.  Endocrine: Denied endocrine symptoms including hot flashes and night sweats.   Meds:   No current outpatient medications on file prior to visit.   No current facility-administered medications on file prior to visit.     Upstream - 07/23/21 1404       Pregnancy Intention Screening   Does the patient want to become pregnant in the next year? No    Does the patient's partner want to become pregnant in the next year? No    Would the patient like to discuss contraceptive options today? Yes      Contraception Wrap Up   Current Method No Method - Other Reason    End Method Oral Contraceptive    Contraception Counseling Provided Yes            The pregnancy intention screening data noted above was reviewed. Potential methods of contraception were discussed. The patient elected to proceed with Oral Contraceptive.    Objective:     Vitals:   07/23/21 1346  BP: 120/75  Pulse: (!) 53  Resp: 16    Filed Weights   07/23/21 1346  Weight: 178 lb 8 oz (81 kg)              Physical examination General NAD, Conversant  HEENT Atraumatic; Op clear with mmm.  Normo-cephalic. Pupils reactive. Anicteric sclerae  Thyroid/Neck Smooth without nodularity or enlargement. Normal ROM.  Neck Supple.  Skin No rashes, lesions or ulceration. Normal  palpated skin turgor. No nodularity.  Breasts: No masses or discharge.  Symmetric.  No axillary adenopathy.  Lungs: Clear to auscultation.No rales or wheezes. Normal Respiratory effort, no retractions.  Heart: NSR.  No murmurs or rubs appreciated. No periferal edema  Abdomen: Soft.  Non-tender.  No masses.  No HSM. No hernia  Extremities: Moves all appropriately.  Normal ROM for age. No lymphadenopathy.  Neuro: Oriented to PPT.  Normal mood. Normal affect.     Pelvic:   Vulva: Normal appearance.  No lesions.  Vagina: No lesions or abnormalities noted.  Support: Normal pelvic support.  Urethra No masses tenderness or scarring.  Meatus Normal size without lesions or prolapse.  Cervix: Normal appearance.  No lesions.  Anus: Normal exam.  No lesions.  Perineum: Normal exam.  No lesions.        Bimanual   Uterus: Normal size.  Non-tender.  Mobile.  AV.  Adnexae: No masses.  Non-tender to palpation.  Cul-de-sac: Negative for abnormality.     Assessment:    G1P1001 Patient Active Problem List   Diagnosis Date Noted   Seasonal allergies 10/25/2020     1. Well woman exam with routine gynecological exam   2. Birth control counseling     Normal exam   Plan:            1.  Basic Screening Recommendations The basic screening recommendations for asymptomatic women were discussed with the patient during her visit.  The age-appropriate recommendations were discussed with her and the rational for the tests reviewed.  When I am informed by the patient that another primary care physician has previously obtained the age-appropriate tests and they are up-to-date, only outstanding tests are ordered and referrals given as necessary.  Abnormal results of tests will be discussed with her when all of her results are completed.  Routine preventative health maintenance measures emphasized: Exercise/Diet/Weight control, Tobacco Warnings, Alcohol/Substance use risks and Stress Management 2.  OCPs The  risks /benefits of OCPs have been explained to the patient in detail.  Product literature has been given to her where appropriate.  I have instructed her in the use of OCPs.  I have explained to the patient that OCPs are not as effective for birth control during the first month of use, and that another form of contraception should be used during this time.  Both first-day start and Sunday start have been explained.  The risks and benefits of each was discussed.  She has been made aware of  the fact that in rare circumstances, other medications may affect the efficacy of OCPs.  I have answered all of her questions, and I believe that she has an understanding of the effectiveness and use of OCPs. We will start Ortho Tri-Cyclen Lo.  First day first day start. Orders No orders of the defined types were placed in this encounter.    Meds ordered this encounter  Medications   Norgestimate-Ethinyl Estradiol Triphasic (ORTHO TRI-CYCLEN LO) 0.18/0.215/0.25 MG-25 MCG tab    Sig: Take 1 tablet by mouth at bedtime for 28 days.    Dispense:  84 tablet    Refill:  3  F/U  Return in about 1 year (around 07/23/2022) for Annual Physical.  Elonda Husky, M.D. 07/23/2021 2:15 PM

## 2021-08-23 ENCOUNTER — Emergency Department
Admission: EM | Admit: 2021-08-23 | Discharge: 2021-08-23 | Disposition: A | Discharge status: Home / Self Care | Payer: BLUE CROSS/BLUE SHIELD | Attending: Emergency Medicine | Admitting: Emergency Medicine

## 2021-08-23 ENCOUNTER — Other Ambulatory Visit: Payer: Self-pay

## 2021-08-23 ENCOUNTER — Encounter: Payer: Self-pay | Admitting: Emergency Medicine

## 2021-08-23 DIAGNOSIS — M67479 Ganglion, unspecified ankle and foot: Secondary | ICD-10-CM

## 2021-08-23 DIAGNOSIS — Z87891 Personal history of nicotine dependence: Secondary | ICD-10-CM | POA: Diagnosis not present

## 2021-08-23 DIAGNOSIS — M67471 Ganglion, right ankle and foot: Secondary | ICD-10-CM | POA: Diagnosis not present

## 2021-08-23 DIAGNOSIS — M79671 Pain in right foot: Secondary | ICD-10-CM | POA: Diagnosis not present

## 2021-08-23 NOTE — ED Provider Notes (Signed)
[facility] Emergency Department Provider Note ____________________________________________  Time seen: 1317  I have reviewed the triage vital signs and the nursing notes.  HISTORY  Chief Complaint  Foot Pain   HPI Diana Maxwell is a 25 y.o. female presents herself to the ED for evaluation of a small amount that she noted to the top of her right foot.  Patient notes tenderness when she dorsiflexes the toes but she denies any recent trauma or injury.  She also denies any swelling or distal paresthesias.  Past Medical History:  Diagnosis Date   Anemia    Gonorrhea 2016    Patient Active Problem List   Diagnosis Date Noted   Seasonal allergies 10/25/2020    Past Surgical History:  Procedure Laterality Date   NO PAST SURGERIES      Prior to Admission medications   Medication Sig Start Date End Date Taking? Authorizing Provider  Norgestimate-Ethinyl Estradiol Triphasic (ORTHO TRI-CYCLEN LO) 0.18/0.215/0.25 MG-25 MCG tab Take 1 tablet by mouth at bedtime for 28 days. 07/23/21 08/20/21  Linzie Collin, MD    Allergies Pineapple  Family History  Problem Relation Age of Onset   Hypertension Maternal Grandmother    Diabetes Maternal Grandmother    Liver cancer Maternal Grandmother    Brain cancer Paternal Grandmother    Breast cancer Maternal Aunt 39    Social History Social History   Tobacco Use   Smoking status: Former    Packs/day: 0.25    Types: Cigarettes    Quit date: 05/04/2014    Years since quitting: 7.3   Smokeless tobacco: Never  Vaping Use   Vaping Use: Some days   Devices: Hooka  Substance Use Topics   Alcohol use: Yes    Comment: ocassionally   Drug use: Never    Review of Systems  Constitutional: Negative for fever. Cardiovascular: Negative for chest pain. Respiratory: Negative for shortness of breath. Gastrointestinal: Negative for abdominal pain, vomiting and diarrhea. Genitourinary: Negative for  dysuria. Musculoskeletal: Negative for back pain.  Right foot pain as above. Skin: Negative for rash. Neurological: Negative for headaches, focal weakness or numbness. ____________________________________________  PHYSICAL EXAM:  VITAL SIGNS: ED Triage Vitals  Enc Vitals Group     BP 08/23/21 1220 104/66     Pulse Rate 08/23/21 1220 60     Resp 08/23/21 1220 20     Temp 08/23/21 1220 98.9 F (37.2 C)     Temp Source 08/23/21 1220 Oral     SpO2 08/23/21 1220 100 %     Weight 08/23/21 1220 175 lb (79.4 kg)     Height 08/23/21 1220 5\' 11"  (1.803 m)     Head Circumference --      Peak Flow --      Pain Score 08/23/21 1213 0     Pain Loc --      Pain Edu? --      Excl. in GC? --     Constitutional: Alert and oriented. Well appearing and in no distress. Head: Normocephalic and atraumatic. Cardiovascular: Normal rate, regular rhythm. Normal distal pulses. Respiratory: Normal respiratory effort. No wheezes/rales/rhonchi. Musculoskeletal: Right foot without obvious deformity or dislocation.  Patient with a small cystic lesion across the dorsal aspect of the extensor tendon of the great toe.  It is mildly tender to palpation but mobile and not fixed.  Nontender with normal range of motion in all extremities.  Neurologic:  Normal gait without ataxia. Normal speech and language. No gross focal  neurologic deficits are appreciated. Skin:  Skin is warm, dry and intact. No rash noted. Psychiatric: Mood and affect are normal. Patient exhibits appropriate insight and judgment. ____________________________________________    {LABS (pertinent positives/negatives)  ____________________________________________  {EKG  ____________________________________________   RADIOLOGY Official radiology report(s): No results found. ____________________________________________  PROCEDURES   Procedures ____________________________________________   INITIAL IMPRESSION / ASSESSMENT AND PLAN / ED  COURSE  As part of my medical decision making, I reviewed the following data within the electronic MEDICAL RECORD NUMBER Notes from prior ED visits   DDX: foot contusion, tendinitis, sprain, ganglion cyst  Patient ED evaluation of a small cystic lesion to the top of the right foot on presentation.  She presents with a small cystic lesion over the extensor tendon, consistent with a likely ganglion cyst.  Patient is reassured at this time, and was referred to podiatry for further evaluation of her symptoms.  Return precautions of been reviewed.  Carlton Sweaney was evaluated in Emergency Department on 08/23/2021 for the symptoms described in the history of present illness. She was evaluated in the context of the global COVID-19 pandemic, which necessitated consideration that the patient might be at risk for infection with the SARS-CoV-2 virus that causes COVID-19. Institutional protocols and algorithms that pertain to the evaluation of patients at risk for COVID-19 are in a state of rapid change based on information released by regulatory bodies including the CDC and federal and state organizations. These policies and algorithms were followed during the patient's care in the ED. ____________________________________________  FINAL CLINICAL IMPRESSION(S) / ED DIAGNOSES  Final diagnoses:  Ganglion cyst of foot      Karmen Stabs, Charlesetta Ivory, PA-C 08/24/21 1110    Dionne Bucy, MD 08/24/21 1223

## 2021-08-23 NOTE — ED Triage Notes (Signed)
Pt reports yesterday she noticed that if she put on tight shoes there was a place in the top of her right foot poking up and it would cause pain. Pt denies pain currently in the shoes she is wearing. Pt denies injuries.

## 2021-08-23 NOTE — Discharge Instructions (Addendum)
Follow-up with podiatry for further evaluation and management of your ganglion cyst.

## 2022-07-28 ENCOUNTER — Ambulatory Visit (INDEPENDENT_AMBULATORY_CARE_PROVIDER_SITE_OTHER): Payer: BLUE CROSS/BLUE SHIELD | Admitting: Obstetrics and Gynecology

## 2022-07-28 ENCOUNTER — Encounter: Payer: Self-pay | Admitting: Obstetrics and Gynecology

## 2022-07-28 ENCOUNTER — Other Ambulatory Visit: Payer: Self-pay

## 2022-07-28 ENCOUNTER — Other Ambulatory Visit (HOSPITAL_COMMUNITY)
Admission: RE | Admit: 2022-07-28 | Discharge: 2022-07-28 | Disposition: A | Discharge status: Home / Self Care | Payer: BLUE CROSS/BLUE SHIELD | Source: Ambulatory Visit | Attending: Obstetrics and Gynecology | Admitting: Obstetrics and Gynecology

## 2022-07-28 VITALS — BP 116/77 | HR 55 | Ht 71.0 in | Wt 191.6 lb

## 2022-07-28 DIAGNOSIS — Z113 Encounter for screening for infections with a predominantly sexual mode of transmission: Secondary | ICD-10-CM | POA: Diagnosis not present

## 2022-07-28 DIAGNOSIS — Z01419 Encounter for gynecological examination (general) (routine) without abnormal findings: Secondary | ICD-10-CM

## 2022-07-28 DIAGNOSIS — Z124 Encounter for screening for malignant neoplasm of cervix: Secondary | ICD-10-CM

## 2022-07-28 DIAGNOSIS — Z3009 Encounter for other general counseling and advice on contraception: Secondary | ICD-10-CM

## 2022-07-28 MED ORDER — NORGESTIM-ETH ESTRAD TRIPHASIC 0.18/0.215/0.25 MG-25 MCG PO TABS: 1.0000 | ORAL_TABLET | Freq: Every day | ORAL | 0 refills | Status: DC

## 2022-07-28 NOTE — Addendum Note (Signed)
Addended by: Georgiana Shore R on: 07/28/2022 12:01 PM   Modules accepted: Orders

## 2022-07-28 NOTE — Progress Notes (Signed)
Patients presents for annual exam today. She states recently starting OCP's again, no issues. Patient is due for pap smear, ordered. She states wanting STD screening today, cultures and lab work. Patient states no other questions or concerns at this time.

## 2022-07-28 NOTE — Progress Notes (Signed)
HPI:      Diana Maxwell is a 26 y.o. G1P1001 who LMP was Patient's last menstrual period was 07/02/2022 (exact date).  Subjective:   She presents today for her annual examination.  She has no complaints.  She is taking OCPs and would like to continue.  She has normal regular cycles.  She is requesting STD testing today however she reports no symptoms and she is not particularly concerned about a partner.    Hx: The following portions of the patient's history were reviewed and updated as appropriate:             She  has a past medical history of Anemia and Gonorrhea (2016). She does not have any pertinent problems on file. She  has a past surgical history that includes No past surgeries. Her family history includes Brain cancer in her paternal grandmother; Breast cancer (age of onset: 23) in her maternal aunt; Diabetes in her maternal grandmother; Hypertension in her maternal grandmother; Liver cancer in her maternal grandmother. She  reports that she quit smoking about 8 years ago. Her smoking use included cigarettes. She smoked an average of .25 packs per day. She has never used smokeless tobacco. She reports current alcohol use. She reports that she does not use drugs. She has a current medication list which includes the following prescription(s): norgestimate-ethinyl estradiol triphasic. She is allergic to pineapple.       Review of Systems:  Review of Systems  Constitutional: Denied constitutional symptoms, night sweats, recent illness, fatigue, fever, insomnia and weight loss.  Eyes: Denied eye symptoms, eye pain, photophobia, vision change and visual disturbance.  Ears/Nose/Throat/Neck: Denied ear, nose, throat or neck symptoms, hearing loss, nasal discharge, sinus congestion and sore throat.  Cardiovascular: Denied cardiovascular symptoms, arrhythmia, chest pain/pressure, edema, exercise intolerance, orthopnea and palpitations.  Respiratory: Denied pulmonary symptoms, asthma,  pleuritic pain, productive sputum, cough, dyspnea and wheezing.  Gastrointestinal: Denied, gastro-esophageal reflux, melena, nausea and vomiting.  Genitourinary: Denied genitourinary symptoms including symptomatic vaginal discharge, pelvic relaxation issues, and urinary complaints.  Musculoskeletal: Denied musculoskeletal symptoms, stiffness, swelling, muscle weakness and myalgia.  Dermatologic: Denied dermatology symptoms, rash and scar.  Neurologic: Denied neurology symptoms, dizziness, headache, neck pain and syncope.  Psychiatric: Denied psychiatric symptoms, anxiety and depression.  Endocrine: Denied endocrine symptoms including hot flashes and night sweats.   Meds:   Current Outpatient Medications on File Prior to Visit  Medication Sig Dispense Refill   Norgestimate-Ethinyl Estradiol Triphasic (ORTHO TRI-CYCLEN LO) 0.18/0.215/0.25 MG-25 MCG tab Take 1 tablet by mouth at bedtime for 28 days. 84 tablet 3   No current facility-administered medications on file prior to visit.     Objective:     Vitals:   07/28/22 1122  BP: 116/77  Pulse: (!) 55    Filed Weights   07/28/22 1122  Weight: 191 lb 9.6 oz (86.9 kg)              Physical examination General NAD, Conversant  HEENT Atraumatic; Op clear with mmm.  Normo-cephalic. Pupils reactive. Anicteric sclerae  Thyroid/Neck Smooth without nodularity or enlargement. Normal ROM.  Neck Supple.  Skin No rashes, lesions or ulceration. Normal palpated skin turgor. No nodularity.  Breasts: No masses or discharge.  Symmetric.  No axillary adenopathy.  Lungs: Clear to auscultation.No rales or wheezes. Normal Respiratory effort, no retractions.  Heart: NSR.  No murmurs or rubs appreciated. No periferal edema  Abdomen: Soft.  Non-tender.  No masses.  No HSM. No hernia  Extremities:  Moves all appropriately.  Normal ROM for age. No lymphadenopathy.  Neuro: Oriented to PPT.  Normal mood. Normal affect.     Pelvic:   Vulva: Normal  appearance.  No lesions.  Vagina: No lesions or abnormalities noted.  Support: Normal pelvic support.  Urethra No masses tenderness or scarring.  Meatus Normal size without lesions or prolapse.  Cervix: Normal appearance.  No lesions.  Anus: Normal exam.  No lesions.  Perineum: Normal exam.  No lesions.        Bimanual   Uterus: Normal size.  Non-tender.  Mobile.  AV.  Adnexae: No masses.  Non-tender to palpation.  Cul-de-sac: Negative for abnormality.     Assessment:    G1P1001 Patient Active Problem List   Diagnosis Date Noted   Seasonal allergies 10/25/2020     1. Well woman exam with routine gynecological exam   2. Cervical cancer screening   3. Screen for STD (sexually transmitted disease)        Plan:            1.  Basic Screening Recommendations The basic screening recommendations for asymptomatic women were discussed with the patient during her visit.  The age-appropriate recommendations were discussed with her and the rational for the tests reviewed.  When I am informed by the patient that another primary care physician has previously obtained the age-appropriate tests and they are up-to-date, only outstanding tests are ordered and referrals given as necessary.  Abnormal results of tests will be discussed with her when all of her results are completed.  Routine preventative health maintenance measures emphasized: Exercise/Diet/Weight control, Tobacco Warnings, Alcohol/Substance use risks and Stress Management  Patient requesting STD testing. -Orders as written.  Continue OCPs  Orders No orders of the defined types were placed in this encounter.   No orders of the defined types were placed in this encounter.         F/U  No follow-ups on file.  Elonda Husky, M.D. 07/28/2022 11:58 AM

## 2022-07-29 LAB — CERVICOVAGINAL ANCILLARY ONLY
Chlamydia: NEGATIVE
Comment: NEGATIVE
Comment: NEGATIVE
Comment: NORMAL
Neisseria Gonorrhea: NEGATIVE
Trichomonas: NEGATIVE

## 2022-07-29 LAB — HEPATITIS C ANTIBODY: Hep C Virus Ab: NONREACTIVE

## 2022-07-29 LAB — HEP, RPR, HIV PANEL
HIV Screen 4th Generation wRfx: NONREACTIVE
Hepatitis B Surface Ag: NEGATIVE
RPR Ser Ql: NONREACTIVE

## 2022-07-30 LAB — CYTOLOGY - PAP: Diagnosis: NEGATIVE

## 2022-11-10 ENCOUNTER — Ambulatory Visit: Admit: 2022-11-10 | Discharge status: Absent without leave | Payer: BLUE CROSS/BLUE SHIELD

## 2022-11-11 ENCOUNTER — Ambulatory Visit
Admission: EM | Admit: 2022-11-11 | Discharge: 2022-11-11 | Disposition: A | Discharge status: Home / Self Care | Payer: BLUE CROSS/BLUE SHIELD | Attending: Emergency Medicine | Admitting: Emergency Medicine

## 2022-11-11 VITALS — BP 103/66 | HR 105 | Temp 99.8°F | Resp 18 | Ht 71.0 in | Wt 192.0 lb

## 2022-11-11 DIAGNOSIS — R509 Fever, unspecified: Secondary | ICD-10-CM

## 2022-11-11 DIAGNOSIS — Z20822 Contact with and (suspected) exposure to covid-19: Secondary | ICD-10-CM

## 2022-11-11 DIAGNOSIS — B349 Viral infection, unspecified: Secondary | ICD-10-CM

## 2022-11-11 NOTE — ED Triage Notes (Signed)
Patient to Urgent Care with complaints of productive cough (mucus), headache, fevers. Reports she has recently been in contact with her cousin who is covid positive.  Symptoms started Monday.   Fever max 102. Last fever yesterday.  Taking nyquil/ motrin.

## 2022-11-11 NOTE — ED Provider Notes (Signed)
Diana Maxwell    CSN: [id number] Arrival date & time: 11/11/22  1815      History   Chief Complaint Chief Complaint  Patient presents with   Fever   Cough    HPI Diana Maxwell is a 26 y.o. female.  Patient presents with fever, headache, cough x 2 days.  Tmax 102.  Treatment at home with ibuprofen and NyQuil.  She denies sore throat, shortness of breath, vomiting, diarrhea, or other symptoms.  She reports exposure to COVID.   The history is provided by the patient and medical records.    Past Medical History:  Diagnosis Date   Anemia    Gonorrhea 2016    Patient Active Problem List   Diagnosis Date Noted   Fever, unspecified 11/11/2022   Seasonal allergies 10/25/2020    Past Surgical History:  Procedure Laterality Date   NO PAST SURGERIES      OB History     Gravida  1   Para  1   Term  1   Preterm      AB      Living  1      SAB      IAB      Ectopic      Multiple  0   Live Births  1            Home Medications    Prior to Admission medications   Medication Sig Start Date End Date Taking? Authorizing Provider  Norgestimate-Ethinyl Estradiol Triphasic (ORTHO TRI-CYCLEN LO) 0.18/0.215/0.25 MG-25 MCG tab Take 1 tablet by mouth at bedtime. 07/28/22 10/26/22  Linzie Collin, MD    Family History Family History  Problem Relation Age of Onset   Hypertension Maternal Grandmother    Diabetes Maternal Grandmother    Liver cancer Maternal Grandmother    Brain cancer Paternal Grandmother    Breast cancer Maternal Aunt 62    Social History Social History   Tobacco Use   Smoking status: Former    Packs/day: 0.25    Types: Cigarettes    Quit date: 05/04/2014    Years since quitting: 8.5   Smokeless tobacco: Never  Vaping Use   Vaping Use: Some days   Devices: Hooka  Substance Use Topics   Alcohol use: Yes    Comment: ocassionally   Drug use: Never     Allergies   Pineapple   Review of Systems Review of Systems   Constitutional:  Positive for fever. Negative for chills.  HENT:  Negative for ear pain and sore throat.   Respiratory:  Positive for cough. Negative for shortness of breath.   Cardiovascular:  Negative for chest pain and palpitations.  Gastrointestinal:  Negative for diarrhea and vomiting.  Skin:  Negative for color change and rash.  Neurological:  Positive for headaches.  All other systems reviewed and are negative.    Physical Exam Triage Vital Signs ED Triage Vitals  Enc Vitals Group     BP --      Pulse Rate 11/11/22 1835 (!) 105     Resp 11/11/22 1835 18     Temp 11/11/22 1835 99.8 F (37.7 C)     Temp src --      SpO2 11/11/22 1835 98 %     Weight 11/11/22 1841 192 lb (87.1 kg)     Height 11/11/22 1841 5\' 11"  (1.803 m)     Head Circumference --      Peak Flow --  Pain Score 11/11/22 1833 4     Pain Loc --      Pain Edu? --      Excl. in GC? --    No data found.  Updated Vital Signs BP 103/66   Pulse (!) 105   Temp 99.8 F (37.7 C)   Resp 18   Ht 5\' 11"  (1.803 m)   Wt 192 lb (87.1 kg)   LMP 10/13/2022   SpO2 98%   BMI 26.78 kg/m   Visual Acuity Right Eye Distance:   Left Eye Distance:   Bilateral Distance:    Right Eye Near:   Left Eye Near:    Bilateral Near:     Physical Exam Vitals and nursing note reviewed.  Constitutional:      General: She is not in acute distress.    Appearance: She is well-developed. She is not ill-appearing.  HENT:     Right Ear: Tympanic membrane normal.     Left Ear: Tympanic membrane normal.     Nose: Nose normal.     Mouth/Throat:     Mouth: Mucous membranes are moist.     Pharynx: Oropharynx is clear.  Cardiovascular:     Rate and Rhythm: Normal rate and regular rhythm.     Heart sounds: Normal heart sounds.  Pulmonary:     Effort: Pulmonary effort is normal. No respiratory distress.     Breath sounds: Normal breath sounds.  Musculoskeletal:     Cervical back: Neck supple.  Skin:    General: Skin is  warm and dry.  Neurological:     Mental Status: She is alert.  Psychiatric:        Mood and Affect: Mood normal.        Behavior: Behavior normal.      UC Treatments / Results  Labs (all labs ordered are listed, but only abnormal results are displayed) Labs Reviewed  SARS CORONAVIRUS 2 (TAT 6-24 HRS)    EKG   Radiology No results found.  Procedures Procedures (including critical care time)  Medications Ordered in UC Medications - No data to display  Initial Impression / Assessment and Plan / UC Course  I have reviewed the triage vital signs and the nursing notes.  Pertinent labs & imaging results that were available during my care of the patient were reviewed by me and considered in my medical decision making (see chart for details).    Viral illness, Exposure to COVID.  COVID pending.  Discussed symptomatic treatment including Tylenol or ibuprofen, rest, hydration.  Instructed patient to follow up with PCP if symptoms are not improving.  She agrees to plan of care.   Final Clinical Impressions(s) / UC Diagnoses   Final diagnoses:  Fever, unspecified  Viral illness  Exposure to COVID-19 virus     Discharge Instructions      Your COVID test is pending.    Take Tylenol or ibuprofen as needed for fever or discomfort.  Rest and keep yourself hydrated.    Follow-up with your primary care provider if your symptoms are not improving.         ED Prescriptions   None    PDMP not reviewed this encounter.   10/15/2022, NP 11/11/22 1901

## 2022-11-11 NOTE — Discharge Instructions (Signed)
Your COVID test is pending.    Take Tylenol or ibuprofen as needed for fever or discomfort.  Rest and keep yourself hydrated.    Follow-up with your primary care provider if your symptoms are not improving.     

## 2022-11-12 LAB — SARS CORONAVIRUS 2 (TAT 6-24 HRS): SARS Coronavirus 2: NEGATIVE

## 2023-01-16 ENCOUNTER — Other Ambulatory Visit: Payer: Self-pay | Admitting: Obstetrics and Gynecology

## 2023-01-16 DIAGNOSIS — Z01419 Encounter for gynecological examination (general) (routine) without abnormal findings: Secondary | ICD-10-CM

## 2023-01-16 DIAGNOSIS — Z3009 Encounter for other general counseling and advice on contraception: Secondary | ICD-10-CM

## 2024-05-19 ENCOUNTER — Encounter: Payer: Self-pay | Admitting: Advanced Practice Midwife

## 2024-05-19 ENCOUNTER — Ambulatory Visit (INDEPENDENT_AMBULATORY_CARE_PROVIDER_SITE_OTHER): Admitting: Advanced Practice Midwife

## 2024-05-19 ENCOUNTER — Other Ambulatory Visit (HOSPITAL_COMMUNITY)
Admission: RE | Admit: 2024-05-19 | Discharge: 2024-05-19 | Disposition: A | Discharge status: Home / Self Care | Payer: Self-pay | Source: Ambulatory Visit | Attending: Advanced Practice Midwife | Admitting: Advanced Practice Midwife

## 2024-05-19 VITALS — BP 96/62 | HR 75 | Ht 71.0 in | Wt 201.0 lb

## 2024-05-19 DIAGNOSIS — Z113 Encounter for screening for infections with a predominantly sexual mode of transmission: Secondary | ICD-10-CM

## 2024-05-19 DIAGNOSIS — Z01419 Encounter for gynecological examination (general) (routine) without abnormal findings: Secondary | ICD-10-CM
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
## 2024-05-19 NOTE — Patient Instructions (Signed)

## 2024-05-20 LAB — HEP, RPR, HIV PANEL
HIV Screen 4th Generation wRfx: NONREACTIVE
Hepatitis B Surface Ag: NEGATIVE
RPR Ser Ql: NONREACTIVE

## 2024-05-21 ENCOUNTER — Ambulatory Visit: Payer: Self-pay | Admitting: Advanced Practice Midwife

## 2024-05-23 LAB — CERVICOVAGINAL ANCILLARY ONLY
Chlamydia: NEGATIVE
Comment: NEGATIVE
Comment: NEGATIVE
Comment: NORMAL
Neisseria Gonorrhea: NEGATIVE
Trichomonas: NEGATIVE

## 2024-09-27 ENCOUNTER — Other Ambulatory Visit: Payer: Self-pay

## 2024-09-27 DIAGNOSIS — Z01419 Encounter for gynecological examination (general) (routine) without abnormal findings: Secondary | ICD-10-CM

## 2024-09-27 DIAGNOSIS — Z3009 Encounter for other general counseling and advice on contraception: Secondary | ICD-10-CM

## 2024-09-27 MED ORDER — NORGESTIMATE-ETH ESTRADIOL 0.18/0.215/0.25 MG-25 MCG PO TABS: 1.0000 | ORAL_TABLET | Freq: Every day | ORAL | 3 refills | Status: AC
